# Patient Record
Sex: Female | Born: 1970 | Hispanic: Yes | Marital: Married | Smoking: Never smoker
Health system: Southern US, Community
[De-identification: ages and names within clinical notes are randomized; demographics above are authoritative.]

## PROBLEM LIST (undated history)

## (undated) DIAGNOSIS — I10 Essential (primary) hypertension: Secondary | ICD-10-CM

## (undated) HISTORY — PX: ABDOMINAL HYSTERECTOMY: SHX81

## (undated) HISTORY — PX: CHOLECYSTECTOMY: SHX55

---

## 1998-09-16 HISTORY — PX: FUNCTIONAL ENDOSCOPIC SINUS SURGERY: SUR616

## 2004-09-16 HISTORY — PX: TUBAL LIGATION: SHX77

## 2008-10-04 ENCOUNTER — Ambulatory Visit: Payer: Self-pay

## 2011-03-04 ENCOUNTER — Ambulatory Visit: Payer: Self-pay | Admitting: Family Medicine

## 2011-05-16 ENCOUNTER — Emergency Department: Payer: Self-pay | Admitting: Emergency Medicine

## 2012-06-17 ENCOUNTER — Ambulatory Visit: Payer: Self-pay | Admitting: Family Medicine

## 2013-07-18 ENCOUNTER — Emergency Department: Payer: Self-pay | Admitting: Emergency Medicine

## 2013-07-18 LAB — CBC
HCT: 30 % — ABNORMAL LOW (ref 35.0–47.0)
HGB: 10 g/dL — ABNORMAL LOW (ref 12.0–16.0)
Platelet: 188 10*3/uL (ref 150–440)
RBC: 4.33 10*6/uL (ref 3.80–5.20)
RDW: 16.4 % — ABNORMAL HIGH (ref 11.5–14.5)
WBC: 7.5 10*3/uL (ref 3.6–11.0)

## 2013-07-18 LAB — BASIC METABOLIC PANEL
Anion Gap: 5 — ABNORMAL LOW (ref 7–16)
Chloride: 107 mmol/L (ref 98–107)
Co2: 24 mmol/L (ref 21–32)
Creatinine: 0.6 mg/dL (ref 0.60–1.30)
EGFR (African American): >60
EGFR (Non-African Amer.): >60
Osmolality: 271 (ref 275–301)
Potassium: 3.8 mmol/L (ref 3.5–5.1)
Sodium: 136 mmol/L (ref 136–145)

## 2013-07-18 LAB — CK TOTAL AND CKMB (NOT AT ARMC): CK-MB: <0.5 ng/mL — ABNORMAL LOW (ref 0.5–3.6)

## 2014-06-29 ENCOUNTER — Ambulatory Visit: Payer: Self-pay | Admitting: Family Medicine

## 2014-09-16 HISTORY — PX: ABDOMINAL HYSTERECTOMY: SHX81

## 2014-09-20 ENCOUNTER — Ambulatory Visit: Payer: Self-pay | Admitting: Obstetrics and Gynecology

## 2014-09-20 LAB — BASIC METABOLIC PANEL
Anion Gap: 8 (ref 7–16)
BUN: 14 mg/dL (ref 7–18)
CREATININE: 0.66 mg/dL (ref 0.60–1.30)
Calcium, Total: 8.6 mg/dL (ref 8.5–10.1)
Chloride: 104 mmol/L (ref 98–107)
Co2: 26 mmol/L (ref 21–32)
GLUCOSE: 97 mg/dL (ref 65–99)
Osmolality: 276 (ref 275–301)
Potassium: 3.9 mmol/L (ref 3.5–5.1)
Sodium: 138 mmol/L (ref 136–145)

## 2014-09-20 LAB — HEMOGLOBIN: HGB: 10.3 g/dL — ABNORMAL LOW (ref 12.0–16.0)

## 2014-09-26 ENCOUNTER — Ambulatory Visit: Payer: Self-pay | Admitting: Obstetrics and Gynecology

## 2014-09-27 LAB — BASIC METABOLIC PANEL
Anion Gap: 8 (ref 7–16)
BUN: 7 mg/dL (ref 7–18)
CALCIUM: 8.2 mg/dL — AB (ref 8.5–10.1)
CHLORIDE: 108 mmol/L — AB (ref 98–107)
CO2: 24 mmol/L (ref 21–32)
CREATININE: 0.66 mg/dL (ref 0.60–1.30)
EGFR (African American): >60
EGFR (Non-African Amer.): >60
Glucose: 118 mg/dL — ABNORMAL HIGH (ref 65–99)
Osmolality: 278 (ref 275–301)
POTASSIUM: 4.1 mmol/L (ref 3.5–5.1)
Sodium: 140 mmol/L (ref 136–145)

## 2014-09-27 LAB — HEMATOCRIT: HCT: 29.9 % — ABNORMAL LOW (ref 35.0–47.0)

## 2015-01-09 LAB — SURGICAL PATHOLOGY

## 2015-01-15 NOTE — Op Note (Signed)
PATIENT NAME:  Veronica Padilla, Veronica Padilla MR#:  [id number] DATE OF BIRTH:  [birth year]  DATE OF PROCEDURE:  09/26/2014  PREOPERATIVE DIAGNOSIS: Menorrhagia unresponsive to conservative therapy.   POSTOPERATIVE DIAGNOSIS: Menorrhagia unresponsive to conservative therapy.   PROCEDURES: 1.  Laparoscopic supracervical hysterectomy.  2.  Bilateral salpingectomy.   SURGEON: Suzy Bouchard, MD   FIRST ASSISTANT: Christeen Douglas, MD  ANESTHESIA: General endotracheal.   INDICATION: A 44 year old gravida 3, para 3, a patient with persistent menorrhagia unresponsive to conservative therapy. The patient is status post 3 prior cesarean sections. The patient elects for permanent surgical intervention.   DESCRIPTION OF PROCEDURE: After adequate general endotracheal anesthesia, the patient was placed in the dorsal supine position. The patient's legs were placed in the [facility] stirrups. SCD compression stockings were on throughout the case. The patient did receive 2 grams IV cefoxitin prior to commencement of the case. The patient's abdomen, perineum, and vagina were prepped and draped in normal sterile fashion. A speculum was placed in the vagina and the anterior cervix was grasped with a single-tooth tenaculum, and uterine sound was advanced into the uterus and was taped together to the anterior tenaculum to be used for uterine manipulation during the procedure. Foley catheter was placed into the bladder yielding clear urine. Gloves were changed, and a 12 mm infraumbilical incision was made after injecting with 0.5% Marcaine. The laparoscope was advanced into the abdominal cavity under direct visualization with the Optiview cannula. The patient's abdomen was inflated with carbon dioxide. A 10 mm port was advanced in the left lower quadrant 3 cm medial to the left anterior iliac spine and under direct visualization the port was placed. A similar procedure was repeated on the patient's right side and again 3  cm medial to the right anterior iliac spine a 10 mm port was advanced. Of note, there was a large amount of dense adhesions from the omentum up to the anterior abdominal wall. Harmonic scalpel was brought up and these adhesions were removed without difficulty. The patient was placed in Trendelenburg and the left cornua was grasped with the atraumatic graspers. The round ligament was then clamped, cauterized with Harmonic scalpel. The uteroovarian ligament was then clamped and transected with the Harmonic scalpel and the uterine artery on the left was skeletonized. The bladder flap was cleared of adhesions, which had been previously noted, and the anterior bladder flap was then dissected free. The left uterine artery was then cauterized with the Kleppingers and then transected with the Harmonic scalpel, and a similar procedure was then repeated on the patient's right side. Again, the round ligament was clamped and transected with the Harmonic scalpel. The uteroovarian ligament was then transected and broad ligament was dissected with the Harmonic scalpel down to the level of the right uterine artery. This artery was cauterized with the Kleppingers and transected with the Harmonic scalpel, and the cervix was then transected at the level of the uterosacral ligaments, finally completed after the uterine sound was removed. The endocervical canal was then cauterized with the Kleppingers, aided by a vaginal hand. Good hemostasis was noted. The morcellator was brought up to the operative field and through the left port site the morcellator was advanced under direct visualization. The uterus was then morcellated through this port site. The left fallopian tube was then grasped at the proximal portion and was removed with the Harmonic scalpel. Good hemostasis noted. The patient's right fallopian tube also was removed with the Harmonic scalpel without difficulty. Good hemostasis noted. There  was a corpus luteal cyst noted on  the right. Otherwise, no other abnormalities were noted. Upper abdomen appeared normal. The patient's abdomen was copiously irrigated, and the pressure was lowered to 7 mmHg. Good hemostasis was noted. The patient's abdomen was deflated and all ports were removed. The left lower port site, right lower port site and infraumbilical incisions were closed with 2 layers, 1 with the fascial layer of 2-0 Vicryl suture, and all skin incisions were reapproximated with interrupted 4-0 Vicryl suture. Dermabond was placed on the incisions followed by small gauze and Tegaderm. The single-tooth tenaculum was removed from the anterior cervix. The tenacula site was oozing a bit and required silver nitrate for hemostasis. There were no complications. Estimated blood loss 25 mL. Intraoperative fluids 1300 mL. Urine output 400 mL. The patient was taken to the recovery room in good condition.  ____________________________ Suzy Bouchardhomas J. Shaunak Kreis, MD tjs:sb D: 09/26/2014 12:33:18 ET T: 09/26/2014 12:48:38 ET JOB#: [id number]  cc: Suzy Bouchardhomas J. Kaevon Cotta, MD, <Dictator> Suzy BouchardHOMAS J Alisson Rozell MD ELECTRONICALLY SIGNED 09/27/2014 9:18

## 2015-05-14 IMAGING — CR DG CHEST 2V
1 series · 1 of 1 positions shown · non-contrast
Comparison: none

REASON FOR EXAM: Chest Pain
COMMENTS:

[pa]
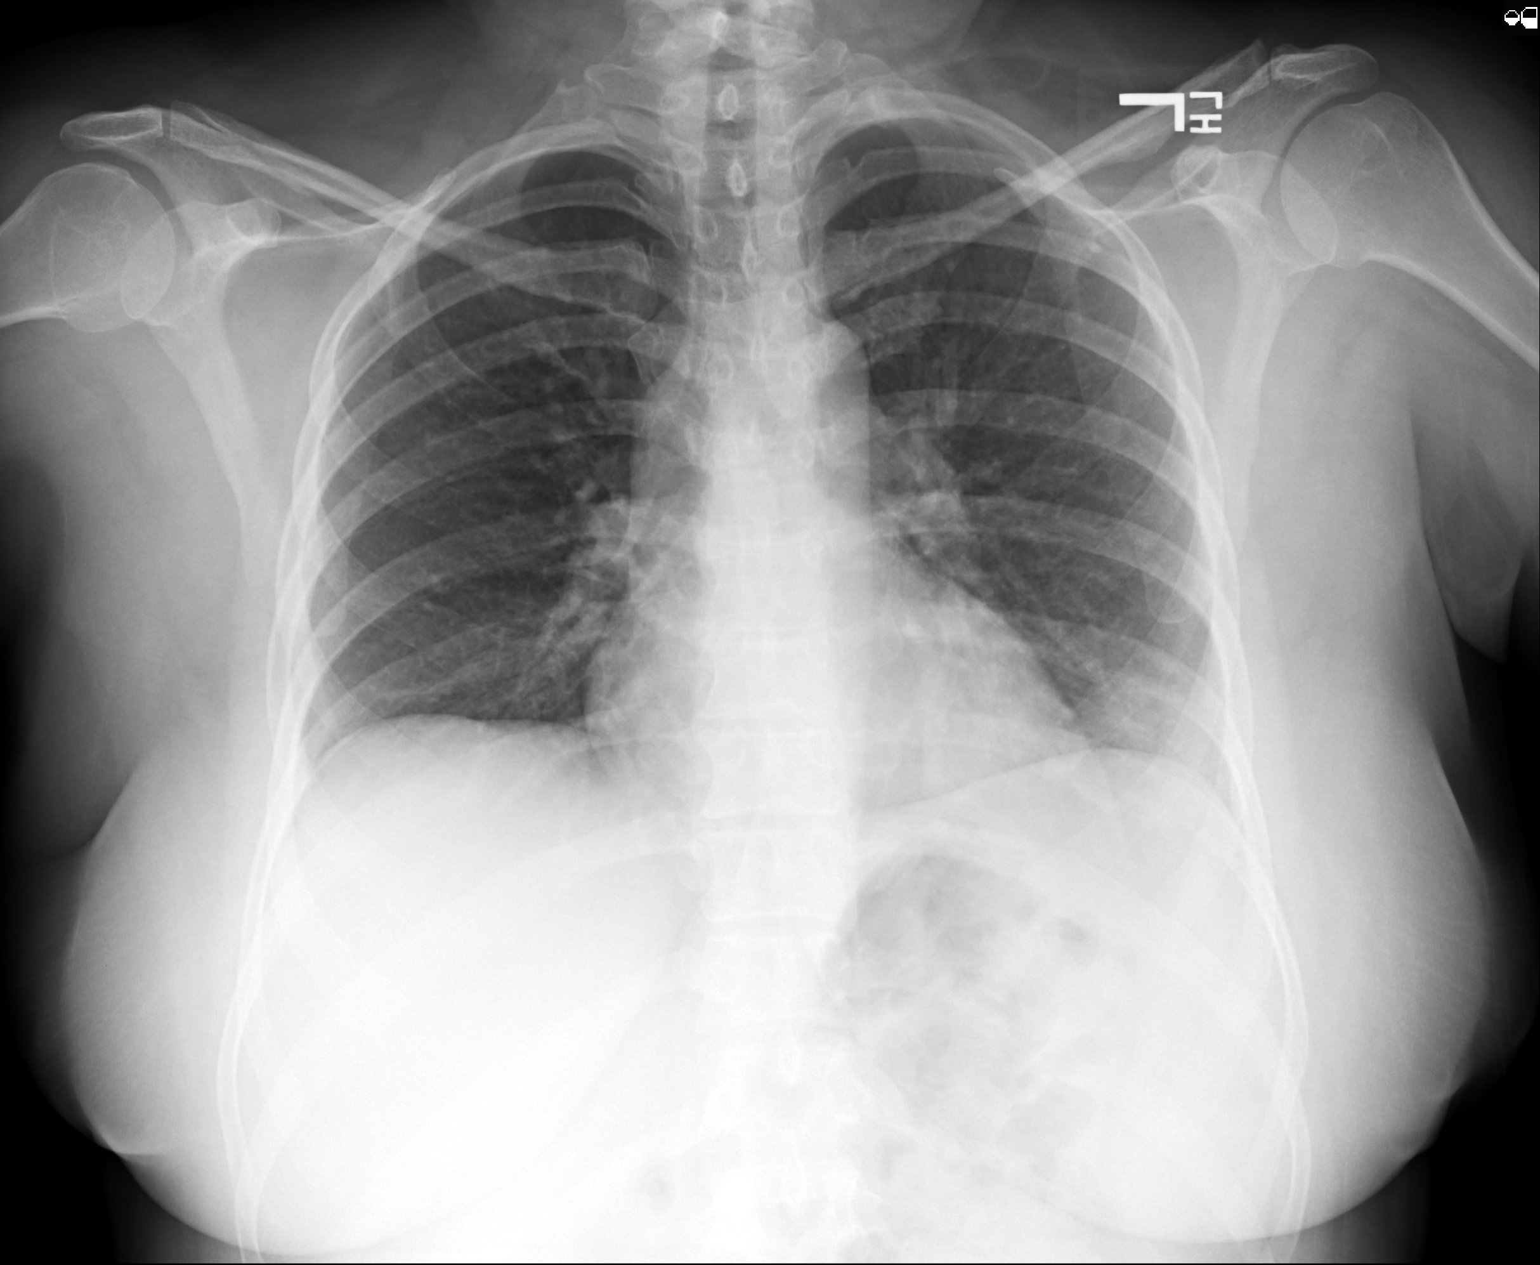

[1 of 1 positions shown; findings below may reference images not displayed]

PROCEDURE:     DXR - DXR CHEST PA (OR AP) AND LATERAL  - July 18, 2013  [DATE]

RESULT:     There is no previous study for comparison.

The lungs are clear. The heart and pulmonary vessels are normal. The bony
and mediastinal structures are unremarkable. There is no effusion. There is
no pneumothorax or evidence of congestive failure.
IMPRESSION: No acute cardiopulmonary disease.

[REDACTED]

## 2015-06-06 ENCOUNTER — Encounter: Payer: Self-pay | Admitting: Emergency Medicine

## 2015-06-06 ENCOUNTER — Emergency Department
Admission: EM | Admit: 2015-06-06 | Discharge: 2015-06-06 | Disposition: A | Discharge status: Home / Self Care | Payer: Self-pay | Attending: Emergency Medicine | Admitting: Emergency Medicine

## 2015-06-06 DIAGNOSIS — R51 Headache: Secondary | ICD-10-CM | POA: Insufficient documentation

## 2015-06-06 DIAGNOSIS — G4452 New daily persistent headache (NDPH): Secondary | ICD-10-CM

## 2015-06-06 DIAGNOSIS — R42 Dizziness and giddiness: Secondary | ICD-10-CM | POA: Insufficient documentation

## 2015-06-06 LAB — CBC
HCT: 38.6 % (ref 35.0–47.0)
Hemoglobin: 13.1 g/dL (ref 12.0–16.0)
MCH: 28.4 pg (ref 26.0–34.0)
MCHC: 33.8 g/dL (ref 32.0–36.0)
MCV: 84 fL (ref 80.0–100.0)
PLATELETS: 191 10*3/uL (ref 150–440)
RBC: 4.6 MIL/uL (ref 3.80–5.20)
RDW: 14.5 % (ref 11.5–14.5)
WBC: 8.4 10*3/uL (ref 3.6–11.0)

## 2015-06-06 LAB — BASIC METABOLIC PANEL WITH GFR
Anion gap: 8 (ref 5–15)
BUN: 13 mg/dL (ref 6–20)
CO2: 24 mmol/L (ref 22–32)
Calcium: 9.1 mg/dL (ref 8.9–10.3)
Chloride: 105 mmol/L (ref 101–111)
Creatinine, Ser: 0.65 mg/dL (ref 0.44–1.00)
GFR calc Af Amer: >60 mL/min
GFR calc non Af Amer: >60 mL/min
Glucose, Bld: 124 mg/dL — ABNORMAL HIGH (ref 65–99)
Potassium: 3.8 mmol/L (ref 3.5–5.1)
Sodium: 137 mmol/L (ref 135–145)

## 2015-06-06 LAB — URINALYSIS COMPLETE WITH MICROSCOPIC (ARMC ONLY)
BILIRUBIN URINE: NEGATIVE
Bacteria, UA: NONE SEEN
GLUCOSE, UA: NEGATIVE mg/dL
KETONES UR: NEGATIVE mg/dL
Leukocytes, UA: NEGATIVE
NITRITE: NEGATIVE
PROTEIN: NEGATIVE mg/dL
Specific Gravity, Urine: 1.011 (ref 1.005–1.030)
pH: 5 (ref 5.0–8.0)

## 2015-06-06 MED ORDER — PREDNISONE 20 MG PO TABS
60.0000 mg | ORAL_TABLET | Freq: Once | ORAL | Status: AC
  Administered 2015-06-06: 60 mg via ORAL
  Filled 2015-06-06: qty 3

## 2015-06-06 MED ORDER — DIPHENHYDRAMINE HCL 25 MG PO CAPS
50.0000 mg | ORAL_CAPSULE | Freq: Once | ORAL | Status: AC
  Administered 2015-06-06: 50 mg via ORAL
  Filled 2015-06-06: qty 2

## 2015-06-06 MED ORDER — DIPHENHYDRAMINE HCL 25 MG PO CAPS: 50.0000 mg | ORAL_CAPSULE | Freq: Four times a day (QID) | ORAL | Status: DC | PRN

## 2015-06-06 MED ORDER — NAPROXEN 500 MG PO TABS: 500.0000 mg | ORAL_TABLET | Freq: Two times a day (BID) | ORAL | Status: DC

## 2015-06-06 MED ORDER — KETOROLAC TROMETHAMINE 60 MG/2ML IM SOLN
60.0000 mg | Freq: Once | INTRAMUSCULAR | Status: AC
  Administered 2015-06-06: 60 mg via INTRAMUSCULAR
  Filled 2015-06-06: qty 2

## 2015-06-06 MED ORDER — METOCLOPRAMIDE HCL 10 MG PO TABS: 10.0000 mg | ORAL_TABLET | Freq: Three times a day (TID) | ORAL | Status: DC

## 2015-06-06 MED ORDER — PREDNISONE 20 MG PO TABS: 20.0000 mg | ORAL_TABLET | Freq: Every day | ORAL | Status: DC

## 2015-06-06 MED ORDER — METOCLOPRAMIDE HCL 10 MG PO TABS
20.0000 mg | ORAL_TABLET | Freq: Once | ORAL | Status: AC
  Administered 2015-06-06: 20 mg via ORAL
  Filled 2015-06-06 (×2): qty 2

## 2015-06-06 NOTE — ED Provider Notes (Signed)
[facility] Emergency Department Provider Note  ____________________________________________  Time seen: 2:20 PM  I have reviewed the triage vital signs and the nursing notes.   HISTORY  Chief Complaint Headache and Dizziness  interviewed and examined with the Spanish interpreter   HPI Veronica Padilla is a 44 y.o. female who complains of daily headaches for the past 2 weeks. The headaches are often on and seemed to be worse in the morning and last anywhere from a few minutes to a few hours. She had some Percocet left over from a hysterectomy in January she has been taking this off and on with some relief. Today the headache seemed worse than usual. She reports that her hands feel a little bit weaker and that she's had some blurry vision at times but her vision is currently normal. Change with position. No focal unilateral paresthesia or weakness. No trauma. No recent pregnancy. No chest pain shortness of breath fever chills nausea vomiting or neck pain.  The headache is bilateral frontal. Aching and throbbing   History reviewed. No pertinent past medical history.   There are no active problems to display for this patient.    Past Surgical History  Procedure Laterality Date  . Abdominal hysterectomy       Current Outpatient Rx  Name  Route  Sig  Dispense  Refill  . diphenhydrAMINE (BENADRYL) 25 mg capsule   Oral   Take 2 capsules (50 mg total) by mouth every 6 (six) hours as needed.   60 capsule   0   . metoCLOPramide (REGLAN) 10 MG tablet   Oral   Take 1 tablet (10 mg total) by mouth 4 (four) times daily -  before meals and at bedtime.   60 tablet   0   . naproxen (NAPROSYN) 500 MG tablet   Oral   Take 1 tablet (500 mg total) by mouth 2 (two) times daily with a meal.   14 tablet   0   . predniSONE (DELTASONE) 20 MG tablet   Oral   Take 1 tablet (20 mg total) by mouth daily.   8 tablet   0      Allergies Review of  patient's allergies indicates no known allergies.   No family history on file.  Social History Social History  Substance Use Topics  . Smoking status: Never Smoker   . Smokeless tobacco: None  . Alcohol Use: No    Review of Systems  Constitutional:   No fever or chills. No weight changes Eyes:  Intermittent blurry vision.  ENT:   No sore throat. Cardiovascular:   No chest pain. Respiratory:   No dyspnea or cough. Gastrointestinal:   Negative for abdominal pain, vomiting and diarrhea.  No BRBPR or melena. Genitourinary:   Negative for dysuria, urinary retention, bloody urine, or difficulty urinating. Musculoskeletal:   Negative for back pain. No joint swelling or pain. Skin:   Negative for rash. Neurological:   Positive headache as above. Psychiatric:  No anxiety or depression.   Endocrine:  No hot/cold intolerance, changes in energy, or sleep difficulty.  10-point ROS otherwise negative.  ____________________________________________   PHYSICAL EXAM:  VITAL SIGNS: ED Triage Vitals  Enc Vitals Group     BP 06/06/15 1247 155/87 mmHg     Pulse Rate 06/06/15 1247 96     Resp 06/06/15 1247 16     Temp 06/06/15 1247 98.3 F (36.8 C)     Temp Source 06/06/15 1247 Oral  SpO2 06/06/15 1247 99 %     Weight 06/06/15 1247 162 lb (73.483 kg)     Height 06/06/15 1247  (1.549 m)     Head Cir --      Peak Flow --      Pain Score 06/06/15 1247 8     Pain Loc --      Pain Edu? --      Excl. in GC? --      Constitutional:   Alert and oriented. Well appearing and in no distress. Eyes:   No scleral icterus. No conjunctival pallor. PERRL. EOMI. Funduscopy normal bilateral without papilledema ENT   Head:   Normocephalic and atraumatic.   Nose:   No congestion/rhinnorhea. No septal hematoma   Mouth/Throat:   MMM, no pharyngeal erythema. No peritonsillar mass. No uvula shift.   Neck:   No stridor. No SubQ emphysema. No  meningismus. Hematological/Lymphatic/Immunilogical:   No cervical lymphadenopathy. Cardiovascular:   RRR. Normal and symmetric distal pulses are present in all extremities. No murmurs, rubs, or gallops. Respiratory:   Normal respiratory effort without tachypnea nor retractions. Breath sounds are clear and equal bilaterally. No wheezes/rales/rhonchi. Gastrointestinal:   Soft and nontender. No distention. There is no CVA tenderness.  No rebound, rigidity, or guarding. Genitourinary:   deferred Musculoskeletal:   Nontender with normal range of motion in all extremities. No joint effusions.  No lower extremity tenderness.  No edema. Neurologic:   Normal speech and language.  CN 2-10 normal. Motor grossly intact. No pronator drift.  Normal gait. No gross focal neurologic deficits are appreciated.  Skin:    Skin is warm, dry and intact. No rash noted.  No petechiae, purpura, or bullae. Psychiatric:   Mood and affect are normal. Speech and behavior are normal. Patient exhibits appropriate insight and judgment.  ____________________________________________    LABS (pertinent positives/negatives) (all labs ordered are listed, but only abnormal results are displayed) Labs Reviewed  BASIC METABOLIC PANEL - Abnormal; Notable for the following:    Glucose, Bld 124 (*)    All other components within normal limits  CBC  URINALYSIS COMPLETEWITH MICROSCOPIC ([facility] ONLY)   ____________________________________________   EKG  Interpreted by me Normal sinus rhythm rate of 88, normal axis intervals QRS ST segments and T waves. There is one PVC on the strip  ____________________________________________    RADIOLOGY    ____________________________________________   PROCEDURES   ____________________________________________   INITIAL IMPRESSION / ASSESSMENT AND PLAN / ED COURSE  Pertinent labs & imaging results that were available during my care of the patient were reviewed by me and  considered in my medical decision making (see chart for details).  Patient presents with a daily headache that is not consistent with any concerning headache pattern such as stroke, intracranial hemorrhage, pseudotumor encephalitis meningitis glaucoma temporal arteritis. Exam and labs are reassuring. We'll treat symptomatically and plan for outpatient follow-up.     ____________________________________________   FINAL CLINICAL IMPRESSION(S) / ED DIAGNOSES  Final diagnoses:  NPDH (new persistent daily headache)      Sharman Cheek, MD 06/06/15 1440

## 2015-06-06 NOTE — ED Notes (Signed)
Per interpreter pt understands medications and D/c instructions.

## 2015-06-06 NOTE — ED Notes (Signed)
Pt reports severe headache since Sunday, reports taking ibuprofen with little improvement. Pt reports dizziness, nausea and weakness.

## 2015-06-06 NOTE — ED Notes (Signed)
Per interpreter, Pt reports intermittent HA for the past few weeks this am worse. Pt reports this HA has been there since 0600 this am. Pt reports vision changes and reports she feels weak in her hands. Pt reports pain is worse in the am. Denies falls or other injuries in the last 2 months.

## 2015-06-06 NOTE — Discharge Instructions (Signed)
Dolor de cabeza general sin causa  (General Headache Without Cause)   EL dolor de cabeza es un dolor o malestar que se siente en la zona de la cabeza o del cuello. Puede no tener una causa especfica. Hay muchas causas y tipos de dolores de [place]. Los ms comunes son:   Cefalea tensional.  Cefaleas migraosas.  Cefalea en brotes.  Cefaleas diarias crnicas. INSTRUCCIONES PARA EL CUIDADO EN EL HOGAR   Cumpla con todas las citas programadas con su mdico o con el especialista al que lo hayan derivado.  Slo tome medicamentos de venta libre o recetados para Primary school teacher o Environmental health practitioner, segn las indicaciones de su mdico.  Cuando sienta dolor de cabeza acustese en un cuarto oscuro y tranquilo.  Lleve un registro diario para Financial risk analyst lo que Press photographer. Por ejemplo, escriba:  Lo que come y bebe.  Cunto tiempo duerme.  Todo cambio en la dieta o medicamentos.  Intente con masajes u otras tcnicas de relajacin.  Colquese compresas de hielo o calor en la cabeza y en el cuello. selos 3 a 4 veces por da de 15 a 20 minutos por vez, o como sea necesario.  Limite las situaciones de estrs.  Sintese con la espalda recta y no  tense los msculos.  Si fuma, deje de hacerlo.  Limite el consumo de bebidas alcohlicas.  Consuma menos cantidad de cafena o deje de tomarla.  Coma y duerma en horarios regulares.  Duerma entre 7 y 9 horas o como lo indique su mdico.  Dietitian las luces tenues si le molestan las luces brillantes y Hospital doctor dolor de [place]. SOLICITE ATENCIN MDICA SI:   Tiene problemas con los Arboriculturist.  Los medicamentos no Education officer, environmental.  El dolor de cabeza que senta habitualmente es diferente.  Tiene nuseas o vmitos. SOLICITE ATENCIN MDICA DE INMEDIATO SI:   El dolor se hace cada vez ms intenso.  Tiene fiebre.  Presenta rigidez en el cuello.  Sufre prdida de la visin.  Presenta debilidad muscular o prdida  del control muscular.  Comienza a perder el equilibrio o tiene problemas para caminar.  Sufre mareos o se desmaya.  Tiene sntomas graves que son diferentes a los primeros sntomas. ASEGRESE DE QUE:   Comprende estas instrucciones.  Controlar su enfermedad.  Solicitar ayuda de inmediato si no mejora o empeora. Document Released: 06/12/2005 Document Revised: 03/03/2012 [facility] Patient Information 2015 [place], [place]. This information is not intended to replace advice given to you by your health care provider. Make sure you discuss any questions you have with your health care provider.  Dolor de Training and development officer, preguntas frecuentes y sus respuestas (Headaches, Frequently Asked Questions) CEFALEAS MIGRAOSAS P: Qu es la migraa? Qu la ocasiona? Cmo puedo tratarla? R: En general, la migraa comienza como un dolor apagado. Luego progresa hacia un dolor, constante, punzante y como un latido. Sentir Scientist, physiological las sienes. Podr sentir Radiographer, therapeutic parte anterior o posterior de la cabeza, o en uno o ambos lados. El dolor suele estar acompaado de una combinacin de:  Nuseas.  Vmitos.  Sensibilidad a la luz y los ruidos. Algunas personas (un 15%) experimentan un aura (ver abajo) antes de un ataque. La causa de la migraa se debe a reacciones qumicas del cerebro. El tratamiento para la migraa puede incluir medicamentos de [place]. Tambin puede incluir tcnicas de [place]. Estas incluyen entrenamientos para la relajacin y biorretroalimentacin.  P: Qu es un aura? R: Alrededor del  15% de las personas con migraa tiene un "aura". Es una seal de sntomas neurolgicos que ocurren antes de un dolor de cabeza por migraas. Podr ver lneas onduladas o irregulares, puntos o luces parpadeantes. Podr experimentar visin de tnel o puntos ciegos en uno o ambos ojos. El aura puede incluir alucinaciones visuales o auditivas (algo que se imagina). Puede incluir trastornos en el olfato  (como olores extraos), el tacto o el gusto. Entre otros sntomas se incluyen:  Adormecimiento.  Sensacin de hormigueo.  Dificultad para recordar o Occupational hygienist. Estos episodios neurolgicos pueden durar hasta 60 minutos. Los sntomas desaparecern a medida que el dolor de cabeza comience. P:Qu es un disparador? R: Ciertos factores fsicos o Sports administrator a "disparar" una migraa. Estos son:  Alimentos.  Cambios hormonales.  Clima.  Estrs. Es importante recordar que los disparadores son diferentes entre si. Para ayudar a prevenir ataques de migraas, necesitar descubrir cules son los Psychologist, prison and probation services. Lleve un diario sobre sus dolores de [place]. Este es un buen modo para descubrir los disparadores. El Sales executive en el momento de hablar con el profesional acerca de su enfermedad. P: El clima afecta en las migraas? R: La luz solar, el calor, la humedad y lo cambios drsticos en la presin Software engineer a, o "disparar" un ataque de migraa en [employer]. Pero estudios han demostrado que el clima no acta como disparador para todas las personas con [place]. P: Cul es la relacin entre la migraa y la hormonas? R: Las hormonas inician y regulan muchas de las funciones corporales. Las hormonas [employer] balance en el cuerpo dentro de los constantes cambios de [place]. Algunas veces, el nivel de hormonas en el cuerpo se desbalancea. Por ejemplo, durante la menstruacin, el embarazo o la [place]. Pueden ser la causa de un ataque de migraa. De hecho, alrededor de tres cuartos de las mujeres con migraa informan que sus ataques estn relacionados con el ciclo menstrual.  P: Aumenta el riesgo de sufrir un choque cardaco en las personas que padecen migraa? R: La probabilidad de que un ataque de migraa ocasione un ataque cardaco es muy remota. Esto no quiere [employer] persona que sufre de migraa no pueda tener un  ataque cardaco asociado con ella. En las personas menores de 40 aos, el factor ms comn para un ataque es la [place]. Pero durante la vida de una persona, la ocurrencia de un dolor de cabeza por migraa est asociada con una reduccin en el riesgo de morir por un ataque cerebrovascular.  P: Cules son los medicamentos para la migraa? R: La medicacin precisa se [place] para tratar el dolor de cabeza una vez que ha comenzado. Son ejemplos, medicamentos de [place], desinflamatorios sin esteroides, ergotamnicos y triptanos.  P: Qu son los triptanos? R: Lo triptanos son Yaakov Guthrie clase de medicamentos abortivos. Son especficos para tratar este problema. Los triptanos son vasoconstrictores. Moderan algunas reacciones qumicas del cerebro. Los triptanos trabajan como receptores del cerebro. Ayudan a Warehouse manager de un neurotransmisor denominado serotonina. Se cree que las fluctuaciones en los [place] de serotonina son la causa principal de la migraa.  P: Son [employer] de venta libre para la migraa? R: Los medicamentos de [employer] pueden ser efectivos para Paramedic dolores leves a moderados y los sntomas asociados a la [place]. Pero deber consultar a un mdico antes de Charity fundraiser tratamiento para la migraa.  P: Cules son los medicamentos de prevencin  de la migraa? R: Se suele denominar tratamiento "profilctico" a los medicamentos para la prevencin de la migraa. Se utilizan para reducir la frecuencia, gravedad y duracin de los ataques de [place]. Son ejemplos de medicamentos de prevencin: antiepilpticos, antidepresivos, bloqueadores beta, bloqueadores de los canales de calcio y medicamentos antiinflamatorios sin estPrint production plannereroides. P:  Por qu se utilizan anticonvulsivantes para tratar la migraa? R: Durante los ltimos aos, ha habido un creciente inters en las drogas antiepilpticas para la prevencin de la migraa. A menudo se los conoce como  "anticonvulsivantes". La epilepsia y la migraa suceden por reacciones similares en el cerebro.  P:  Por qu se utilizan antidepresivos para tratar la migraa? R: Los antidepresivos tpicamente se La[place] People's Democratic Republicutilizan para tratar a las personas con depresin. Pueden reducir la frecuencia de la migraa a travs de la regulacin de los niveles qumicos, como la serotonina, en el cerebro.  P:  Por qu se utilizan terapias alternativas para tratar la migraa? R: El trmino "terapias alternativas" suelen utilizarse para describir los tratamientos que se considera que estn por fuera de Occupational hygienistalcance la medicina occidental convencional. Son ejemplos de las terapias alternativas: la acupuntura, la acupresin y el yoga. Otra terapia alternativa comn es la terapia herbal. Se cree que algunas hierbas ayudan a Corning Incorporatedaliviar los dolores de [place]. Siempre consulte con Bed Bath & Beyondel profesional acerca de las terapias alternativas antes de [place] View Ranchesutilizarlas. Algunos productos herbales contienen arsnico y [place]otras toxinas. DOLORES DE CABEZA POR TENSIN P: Qu es un dolor de cabeza por tensin? Qu lo ocasiona? Cmo puedo tratarlo? R: Los dolores de cabeza por tensin ocurren al azar. A menudo son el resultado de estrs temporario, ansiedad, fatiga o ira. Los sntomas Environmental education officerincluyen dolor en las sienes, una sensacin como de tener una banda alrededor de la cabeza (un dolor que "presiona"). Los sntomas pueden incluir una sensacin de Roachesterempuje, de presin y Engineer, materialscontraccin de los msculos de la cabeza y el cuello. El dolor comienza en la frente, sienes o en la parte posterior de la cabeza y el cuello. El tratamiento para los dolores de cabeza por tensin puede incluir medicamentos de [place]a libre. Tambin puede incluir tcnicas de Peruautoayuda con entrenamientos para la relajacin y biorretroalimentacin. CEFALEA EN RACIMOS P: Qu es una cefalea en racimos? Qu la ocasiona? Cmo puedo tratarla? R: La cefalea en racimos toma su nombre debido a que los ataques vienen en  grupos. El dolor aparece con poco o ningn aviso. Normalmente ocurre de un lado de la cabeza. Muchas veces el dolor viene acompaado de un lagrimeo u ojo rojo y goteo de la nariz del mismo lado que Chief Technology Officerel dolor. Se cree que la causa es una reaccin en las sustancias qumicas del cerebro. Se describe como el caso ms grave e intenso de cualquier tipo de dolor de cabeza. El tratamiento incluye medicamentos bajo receta y oxgeno. CEFALEA SINUSAL P: Qu es una cefalea sinusal? Qu la ocasiona? Cmo puedo tratarla? R: Cuando se inflama una cavidad en los huesos de la cara y el crneo (sinus) ocasiona un dolor localizado. Esta enfermedad generalmente es el resultado de una reaccin alrgica, un tumor o una infeccin. Si el dolor de cabeza est ocasionado por un bloqueo del sinus, como una infeccin, probablemente tendr Halmafiebre. Una imagen de rayos X confirmar el bloqueo del sinus. El tratamiento indicado por el mdico podr incluir antibiticos para la infeccin, y tambin antihistamnicos o descongestivos.  DOLOR DE CABEZA POR EFECTO "REBOTE" P: Qu es un dolor de cabeza por efecto "rebote"? Qu lo ocasiona? Cmo puedo tratarlo?  R: Si se toman medicamentos para el dolor de cabeza muy a menudo puede llevar a la enfermedad conocida como "dolor de cabeza por rebote". Un patrn de abuso de medicamentos para el dolor de cabeza supone tomarlos ms de [employer] por semana o en cantidades excesivas. Esto significa ms que lo que indica el envase o el mdico. Con los dolores de cabeza por rebote, los medicamentos no slo dejan de Engineer, materials sino que adems comienzan a Data processing manager dolores de [place]. Los mdicos tratan los dolores de cabeza por rebote mediante la disminucin del medicamento del que se ha abusado. A veces el medico podr sustituir gradualmente por un tipo diferente de tratamiento o medicacin. Dejar de consumirlo podra ser difcil. El abuso regular de un medicamento aumenta el potencial que se  produzcan efectos secundarios graves. Consulte con un mdico si utiliza regularmente medicamentos para Chief Technology Officer de cabeza ms de [address] por semana o ms de lo que indica el envase. PREGUNTAS Y RESPUESTAS ADICIONALES P: Qu es la biorretroalimentacin? R: La biorretroalimentacin es un tratamiento de [place]. La biorretroalimentacin utiliza un equipamiento especial para controlar los movimientos involuntarios del cuerpo y las respuestas fsicas. La biorretroalimentacin controla:  Respiracin.  P[place]so.  Latidos cardacos.  Temperatura.  Tensin muscular.  Actividad cerebrales. La biorretroalimentacin le ayudar a mejorar y Production manager sus ejercicios de Microbiologist. Aprender a Chief Operating Officer las respuestas fsicas relacionadas con el estrs. Una vez que se dominan las tcnicas no necesitar ms el equipamiento. P: Son hereditarios los dolores de [place]? R: Segn algunas estimaciones, aproximadamente 28 millones de estadounidenses sufren migraa. Cuatro de cada cinco (80%) informan una historia familiar de migraa. Los investigadores no pueden asegurar si se trata de un problema gentico o Patent examiner. A pesar de esto, un nio tiene 50% de probabilidades de sufrir migraa si uno de sus padres la sufre. El nio tiene un 75% de probabilidades si ambos padres la sufren.  P. Puede un nio tener migraa? R: En el momento de ingresar a la escuela secundaria, la mayora de los jvenes han experimentado algn tipo de cefalea. Algunos abordajes o medicamentos seguros y Sports administrator las cefaleas o detenerlas luego de que han comenzado.  Olin Hauser tipo de especialista debe ver para diagnosticar y tratar una cefalea? R: Comience con su mdico de cabecera. Huel [place] de su experiencia y abordaje de las cefaleas. Comente los mtodos de clasificacin, diagnstico y [place]. El profesional decidir si lo derivar a Music therapist, segn los sntomas u otras enfermedades. El  hecho de sufrir diabetes, Environmental consultant, Catering manager, puede requerir un abordaje ms complejo. La National Headache Foundation (Fundacin Nacional para las [place]) Chief Technology Officer, a pedido, Physiological scientist lista de los mdicos que son miembros de [place]. Document Released: 08/15/2008 Document Revised: 11/25/2011 [facility] Patient Information 2015 [place], [place]. This information is not intended to replace advice given to you by your health care provider. Make sure you discuss any questions you have with your health care provider.

## 2018-10-30 ENCOUNTER — Other Ambulatory Visit: Payer: Self-pay | Admitting: Family Medicine

## 2018-10-30 DIAGNOSIS — R590 Localized enlarged lymph nodes: Secondary | ICD-10-CM

## 2020-08-15 ENCOUNTER — Other Ambulatory Visit: Payer: Self-pay

## 2020-08-15 ENCOUNTER — Encounter: Payer: Self-pay | Admitting: *Deleted

## 2020-08-15 DIAGNOSIS — Z5321 Procedure and treatment not carried out due to patient leaving prior to being seen by health care provider: Secondary | ICD-10-CM | POA: Insufficient documentation

## 2020-08-15 DIAGNOSIS — R519 Headache, unspecified: Secondary | ICD-10-CM | POA: Insufficient documentation

## 2020-08-15 NOTE — ED Triage Notes (Signed)
Pt to triage via wheelchair   Pt has a headache since 1900 tonight.  No n/v/d.  No otc meds . Pt alert  Speech clear.  Interpreter on a stick used in triage.

## 2020-08-16 ENCOUNTER — Emergency Department
Admission: EM | Admit: 2020-08-16 | Discharge: 2020-08-16 | Disposition: A | Discharge status: Home / Self Care | Payer: Self-pay | Attending: Emergency Medicine | Admitting: Emergency Medicine

## 2020-08-16 NOTE — ED Notes (Signed)
No answer when called several times from lobby 

## 2020-12-21 ENCOUNTER — Observation Stay
Admission: EM | Admit: 2020-12-21 | Discharge: 2020-12-24 | Disposition: A | Discharge status: Home / Self Care | Payer: BC Managed Care – PPO | Attending: Internal Medicine | Admitting: Internal Medicine

## 2020-12-21 ENCOUNTER — Other Ambulatory Visit: Payer: Self-pay

## 2020-12-21 ENCOUNTER — Emergency Department: Payer: BC Managed Care – PPO

## 2020-12-21 DIAGNOSIS — Z20822 Contact with and (suspected) exposure to covid-19: Secondary | ICD-10-CM | POA: Diagnosis not present

## 2020-12-21 DIAGNOSIS — B179 Acute viral hepatitis, unspecified: Secondary | ICD-10-CM | POA: Insufficient documentation

## 2020-12-21 DIAGNOSIS — I10 Essential (primary) hypertension: Secondary | ICD-10-CM | POA: Diagnosis not present

## 2020-12-21 DIAGNOSIS — Z8616 Personal history of COVID-19: Secondary | ICD-10-CM | POA: Diagnosis not present

## 2020-12-21 DIAGNOSIS — Z2839 Other underimmunization status: Secondary | ICD-10-CM | POA: Diagnosis not present

## 2020-12-21 DIAGNOSIS — R7989 Other specified abnormal findings of blood chemistry: Secondary | ICD-10-CM

## 2020-12-21 DIAGNOSIS — K759 Inflammatory liver disease, unspecified: Secondary | ICD-10-CM

## 2020-12-21 DIAGNOSIS — K8070 Calculus of gallbladder and bile duct without cholecystitis without obstruction: Secondary | ICD-10-CM

## 2020-12-21 DIAGNOSIS — K529 Noninfective gastroenteritis and colitis, unspecified: Secondary | ICD-10-CM

## 2020-12-21 DIAGNOSIS — E65 Localized adiposity: Secondary | ICD-10-CM | POA: Diagnosis present

## 2020-12-21 DIAGNOSIS — R1011 Right upper quadrant pain: Secondary | ICD-10-CM

## 2020-12-21 DIAGNOSIS — R4701 Aphasia: Secondary | ICD-10-CM | POA: Insufficient documentation

## 2020-12-21 DIAGNOSIS — R739 Hyperglycemia, unspecified: Secondary | ICD-10-CM

## 2020-12-21 DIAGNOSIS — K8071 Calculus of gallbladder and bile duct without cholecystitis with obstruction: Secondary | ICD-10-CM | POA: Diagnosis not present

## 2020-12-21 DIAGNOSIS — Z79899 Other long term (current) drug therapy: Secondary | ICD-10-CM | POA: Insufficient documentation

## 2020-12-21 DIAGNOSIS — R109 Unspecified abdominal pain: Secondary | ICD-10-CM

## 2020-12-21 HISTORY — DX: Essential (primary) hypertension: I10

## 2020-12-21 LAB — CBC
HCT: 42.6 % (ref 36.0–46.0)
Hemoglobin: 14.9 g/dL (ref 12.0–15.0)
MCH: 30.9 pg (ref 26.0–34.0)
MCHC: 35 g/dL (ref 30.0–36.0)
MCV: 88.4 fL (ref 80.0–100.0)
Platelets: 217 10*3/uL (ref 150–400)
RBC: 4.82 MIL/uL (ref 3.87–5.11)
RDW: 13 % (ref 11.5–15.5)
WBC: 5.6 10*3/uL (ref 4.0–10.5)
nRBC: 0 % (ref 0.0–0.2)

## 2020-12-21 LAB — URINALYSIS, COMPLETE (UACMP) WITH MICROSCOPIC
Bilirubin Urine: NEGATIVE
Glucose, UA: NEGATIVE mg/dL
Ketones, ur: 20 mg/dL — AB
Leukocytes,Ua: NEGATIVE
Nitrite: NEGATIVE
Protein, ur: NEGATIVE mg/dL
Specific Gravity, Urine: 1.006 (ref 1.005–1.030)
pH: 6 (ref 5.0–8.0)

## 2020-12-21 LAB — COMPREHENSIVE METABOLIC PANEL
ALT: 1210 U/L — ABNORMAL HIGH (ref 0–44)
AST: 735 U/L — ABNORMAL HIGH (ref 15–41)
Albumin: 4.4 g/dL (ref 3.5–5.0)
Alkaline Phosphatase: 194 U/L — ABNORMAL HIGH (ref 38–126)
Anion gap: 10 (ref 5–15)
BUN: 8 mg/dL (ref 6–20)
CO2: 26 mmol/L (ref 22–32)
Calcium: 9.3 mg/dL (ref 8.9–10.3)
Chloride: 101 mmol/L (ref 98–111)
Creatinine, Ser: 0.5 mg/dL (ref 0.44–1.00)
GFR, Estimated: >60 mL/min (ref >60)
Glucose, Bld: 114 mg/dL — ABNORMAL HIGH (ref 70–99)
Potassium: 3.5 mmol/L (ref 3.5–5.1)
Sodium: 137 mmol/L (ref 135–145)
Total Bilirubin: 3.3 mg/dL — ABNORMAL HIGH (ref 0.3–1.2)
Total Protein: 8.2 g/dL — ABNORMAL HIGH (ref 6.5–8.1)

## 2020-12-21 LAB — RESP PANEL BY RT-PCR (FLU A&B, COVID) ARPGX2
Influenza A by PCR: NEGATIVE
Influenza B by PCR: NEGATIVE
SARS Coronavirus 2 by RT PCR: NEGATIVE

## 2020-12-21 LAB — LIPASE, BLOOD: Lipase: 40 U/L (ref 11–51)

## 2020-12-21 LAB — GLUCOSE, CAPILLARY: Glucose-Capillary: 162 mg/dL — ABNORMAL HIGH (ref 70–99)

## 2020-12-21 LAB — PROTIME-INR
INR: 1 (ref 0.8–1.2)
Prothrombin Time: 13.1 seconds (ref 11.4–15.2)

## 2020-12-21 MED ORDER — HYDRALAZINE HCL 25 MG PO TABS: 25.0000 mg | ORAL_TABLET | Freq: Three times a day (TID) | ORAL | Status: DC | PRN

## 2020-12-21 MED ORDER — MORPHINE SULFATE (PF) 4 MG/ML IV SOLN
4.0000 mg | Freq: Once | INTRAVENOUS | Status: AC
  Administered 2020-12-21: 4 mg via INTRAVENOUS
  Filled 2020-12-21: qty 1

## 2020-12-21 MED ORDER — IBUPROFEN 400 MG PO TABS
400.0000 mg | ORAL_TABLET | Freq: Four times a day (QID) | ORAL | Status: DC | PRN
  Administered 2020-12-22: 400 mg via ORAL
  Filled 2020-12-21: qty 1

## 2020-12-21 MED ORDER — LACTATED RINGERS IV BOLUS
1000.0000 mL | Freq: Once | INTRAVENOUS | Status: AC
  Administered 2020-12-21: 1000 mL via INTRAVENOUS

## 2020-12-21 MED ORDER — HYDROCHLOROTHIAZIDE 12.5 MG PO CAPS
12.5000 mg | ORAL_CAPSULE | Freq: Once | ORAL | Status: DC
  Filled 2020-12-21: qty 1

## 2020-12-21 MED ORDER — ENOXAPARIN SODIUM 40 MG/0.4ML ~~LOC~~ SOLN
40.0000 mg | SUBCUTANEOUS | Status: DC
  Administered 2020-12-21: 40 mg via SUBCUTANEOUS
  Filled 2020-12-21: qty 0.4

## 2020-12-21 MED ORDER — MORPHINE SULFATE (PF) 2 MG/ML IV SOLN
2.0000 mg | INTRAVENOUS | Status: AC | PRN
  Administered 2020-12-23: 2 mg via INTRAVENOUS
  Filled 2020-12-21: qty 1

## 2020-12-21 MED ORDER — INSULIN ASPART 100 UNIT/ML ~~LOC~~ SOLN: 0.0000 [IU] | Freq: Every day | SUBCUTANEOUS | Status: DC

## 2020-12-21 MED ORDER — ONDANSETRON HCL 4 MG/2ML IJ SOLN
4.0000 mg | Freq: Once | INTRAMUSCULAR | Status: AC
  Administered 2020-12-21: 4 mg via INTRAVENOUS
  Filled 2020-12-21: qty 2

## 2020-12-21 MED ORDER — HYDROCHLOROTHIAZIDE 12.5 MG PO CAPS
12.5000 mg | ORAL_CAPSULE | Freq: Every day | ORAL | Status: DC
  Administered 2020-12-21: 12.5 mg via ORAL
  Filled 2020-12-21: qty 1

## 2020-12-21 MED ORDER — KETOROLAC TROMETHAMINE 15 MG/ML IJ SOLN
15.0000 mg | Freq: Three times a day (TID) | INTRAMUSCULAR | Status: AC | PRN
  Filled 2020-12-21: qty 1

## 2020-12-21 MED ORDER — ONDANSETRON HCL 4 MG PO TABS: 4.0000 mg | ORAL_TABLET | Freq: Four times a day (QID) | ORAL | Status: DC | PRN

## 2020-12-21 MED ORDER — LACTATED RINGERS IV SOLN: INTRAVENOUS | Status: AC

## 2020-12-21 MED ORDER — INSULIN ASPART 100 UNIT/ML ~~LOC~~ SOLN
0.0000 [IU] | Freq: Three times a day (TID) | SUBCUTANEOUS | Status: DC
  Administered 2020-12-22: 2 [IU] via SUBCUTANEOUS
  Administered 2020-12-23 (×2): 3 [IU] via SUBCUTANEOUS
  Filled 2020-12-21 (×3): qty 1

## 2020-12-21 MED ORDER — ONDANSETRON HCL 4 MG/2ML IJ SOLN
4.0000 mg | Freq: Four times a day (QID) | INTRAMUSCULAR | Status: DC | PRN
  Administered 2020-12-23: 4 mg via INTRAVENOUS
  Filled 2020-12-21 (×2): qty 2

## 2020-12-21 NOTE — H&P (Addendum)
History and Physical   Veronica Padilla SJG:[id number] DOB: [birth year] DOA: 12/21/2020  [facility]  Patient coming from: Urgent care clinic  I have personally briefly reviewed patient's old medical records in [facility] EMR.  Chief Concern: right upper quadrant abdominal pain  HPI obtained with Spanish interpreter on a stick  HPI: Veronica Padilla is a 50 y.o. female with medical history significant for persistent menorrhagia unresponsive to conservative therapy status post laparoscopic supracervical hysterectomy and bilateral salpingectomy, status post cesarean C-section x3, oophorectomy, hypertension,  She had right upper quadrant abdominal pain, that started Tuesday evening, that referred to my back. She also noted dark yellow urine. She endorses intermittent dysuria. She has had pain like this before in January 6 or 7, 2022.  She states that at this time the pain is a 1/10 after the pain medication and on Tuesday, the pain was a 10/10. Prior to presentation to the emergency department today, the pain was a 5/10.  The describes the pain as persistent, takes her breath away, is uncomfortable with standing, laying down, and sitting. She endorses yellow loose stool this AM, 4x. She denies recent seafood ingestion or changes to her diet. She denies knowledge of noxious exposure.  She denies recent travel.  She endorses chills. She does not know if she had a fever.   She does not know if she has been vaccinated for hepatitis A or B. At bedside, she is able to tell me her full name, age, and her location of hospital.  She endorses taking two advils 220 mg each, 1 aspirin (320 mg), and one tylenol (325)in the AM with relief before presentation to the ED.   She reports that she is sexually active with just her spouse.  Social history: she lives with husband and son. She denies tobacco use, etoh use, and recreational drug use. She works in Advertising account executive, folding the socks on heavy machinery.   Vaccinations: yes, all three covid 19 vaccinations. She also had covid infection in January 2021.   ROS: Constitutional: no weight change, no fever ENT/Mouth: no sore throat, no rhinorrhea Eyes: no eye pain, no vision changes Cardiovascular: no chest pain, no dyspnea,  no edema, no palpitations Respiratory: no cough, no sputum, no wheezing Gastrointestinal: + nausea, no vomiting, + diarrhea, no constipation Genitourinary: no urinary incontinence, no dysuria, no hematuria Musculoskeletal: no arthralgias, no myalgias Skin: no skin lesions, no pruritus, Neuro: + weakness, no loss of consciousness, no syncope Psych: no anxiety, no depression, + decrease appetite Heme/Lymph: no bruising, no bleeding  ED Course: Discussed with ED provider  Assessment/Plan  Active Problems:   Acute hepatitis   Essential hypertension   Truncal obesity  # Right upper quadrant abdominal pain secondary to acute hepatitis-etiology work-up in progress # Gastroenteritis -suspect food contamination -Limited right upper quadrant abdominal ultrasound was read as heterogeneously increased hepatic parenchymal echogenicity may represent steatosis or other intrinsic hepatic cellular disease such as hepatitis.  No focal lesion.  Layering stones and sludge in the gallbladder without sonographic findings of acute cholecystitis.  No biliary dilatation. -Acute hepatitis panel ordered, acetaminophen level ordered -GI panel ordered -Supportive care -LFT in the a.m. for liver enzymes trending to ensure that it is coming down -Checking TSH -Pain control morphine 2 mg IV every 3 hours as needed for severe pain, ketorolac 50 mg IV every 6 hours as needed for moderate pain, ibuprofen 400 mg every 6 hours as needed for fever,  headache, mild pain  # Hypertension - previously on HCTZ 12.5 mg daily resumed  # Hepatic parenchymal echogenicity possible hepatosteatosis versus  hepatitis  Patient states that she takes a pill for her sugar-I was not able to find any antihyperglycemic medications -Insulin sliding scale with at bedtime coverage ordered  Chart reviewed.   DVT prophylaxis: Enoxaparin 40 mg subcutaneous every 24 hours Code Status: Daily Diet: Heart healthy/carb modified Family Communication: No Disposition Plan: Pending clinical course Consults called: None at this time, patient may benefit from outpatient follow-up with GI Admission status: Observation to MedSurg, no telemetry  Past Medical History:  Diagnosis Date  . Hypertension    Past Surgical History:  Procedure Laterality Date  . ABDOMINAL HYSTERECTOMY     Social History:  reports that she has never smoked. She has never used smokeless tobacco. She reports that she does not drink alcohol and does not use drugs.  No Known Allergies No family history on file. Family history: Family history reviewed and not pertinent  Prior to Admission medications   Medication Sig Start Date End Date Taking? Authorizing Provider  diphenhydrAMINE (BENADRYL) 25 mg capsule Take 2 capsules (50 mg total) by mouth every 6 (six) hours as needed. 06/06/15   Sharman Cheek, MD  metoCLOPramide (REGLAN) 10 MG tablet Take 1 tablet (10 mg total) by mouth 4 (four) times daily -  before meals and at bedtime. 06/06/15   Sharman Cheek, MD  naproxen (NAPROSYN) 500 MG tablet Take 1 tablet (500 mg total) by mouth 2 (two) times daily with a meal. 06/06/15   Sharman Cheek, MD  predniSONE (DELTASONE) 20 MG tablet Take 1 tablet (20 mg total) by mouth daily. 06/06/15   Sharman Cheek, MD   Physical Exam: Vitals:   12/21/20 1722 12/21/20 1725 12/21/20 1830  BP: (!) 166/105  (!) 148/95  Pulse: 95  98  Resp: 18  14  Temp: 97.7 F (36.5 C)    TempSrc: Oral    SpO2: 99%  99%  Weight:  74.4 kg   Height:  5' (1.524 m)    Constitutional: appears older than chronological age, NAD, calm, comfortable Eyes: PERRL,  lids and conjunctivae normal ENMT: Mucous membranes are moist. Posterior pharynx clear of any exudate or lesions. Age-appropriate dentition. Hearing appropriate Neck: normal, supple, no masses, no thyromegaly Respiratory: clear to auscultation bilaterally, no wheezing, no crackles. Normal respiratory effort. No accessory muscle use.  Cardiovascular: Regular rate and rhythm, no murmurs / rubs / gallops. No extremity edema. 2+ pedal pulses. No carotid bruits.  Abdomen: Obese abdomen, no tenderness, no masses palpated, no hepatosplenomegaly. Bowel sounds positive.  Musculoskeletal: no clubbing / cyanosis. No joint deformity upper and lower extremities. Good ROM, no contractures, no atrophy. Normal muscle tone.  Skin: no rashes, lesions, ulcers. No induration Neurologic: Sensation intact. Strength 5/5 in all 4.  Psychiatric: Normal judgment and insight. Alert and oriented x 3. Normal mood.   EKG: Not indicated  Chest x-ray on Admission: Not indicated  US Abdomen Limited RUQ (LIVER/GB)  Result Date: 12/21/2020 CLINICAL DATA:  Hepatitis. EXAM: ULTRASOUND ABDOMEN LIMITED RIGHT UPPER QUADRANT COMPARISON:  Abdominal ultrasound 07/07/2012 FINDINGS: Gallbladder: Physiologically distended. Layering sludge and multiple stones within the gallbladder. No gallbladder wall thickening or pericholecystic fluid. No sonographic Murphy sign noted by sonographer. Common bile duct: Diameter: 5 mm, normal Liver: No focal lesion identified. Heterogeneously increased in parenchymal echogenicity. Portal vein is patent on color Doppler imaging with normal direction of blood flow towards the liver. Other: No  right upper quadrant ascites. IMPRESSION: 1. Heterogeneously increased hepatic parenchymal echogenicity may represent steatosis or other intrinsic hepatocellular disease such as hepatitis. No focal lesion. 2. Layering stones and sludge in the gallbladder without sonographic findings of acute cholecystitis. No biliary  dilatation. Electronically Signed   By: Narda Rutherford M.D.   On: 12/21/2020 19:11   Labs on Admission: I have personally reviewed following labs  CBC: Recent Labs  Lab 12/21/20 1727  WBC 5.6  HGB 14.9  HCT 42.6  MCV 88.4  PLT 217   Basic Metabolic Panel: Recent Labs  Lab 12/21/20 1727  NA 137  K 3.5  CL 101  CO2 26  GLUCOSE 114*  BUN 8  CREATININE 0.50  CALCIUM 9.3   GFR: Estimated Creatinine Clearance: 76.7 mL/min (by C-G formula based on SCr of 0.5 mg/dL).  Liver Function Tests: Recent Labs  Lab 12/21/20 1727  AST 735*  ALT 1,210*  ALKPHOS 194*  BILITOT 3.3*  PROT 8.2*  ALBUMIN 4.4   Recent Labs  Lab 12/21/20 1727  LIPASE 40   No results for input(s): AMMONIA in the last 168 hours. Coagulation Profile: Recent Labs  Lab 12/21/20 1926  INR 1.0   Urine analysis:    Component Value Date/Time   COLORURINE AMBER (A) 12/21/2020 1728   APPEARANCEUR CLEAR (A) 12/21/2020 1728   LABSPEC 1.006 12/21/2020 1728   PHURINE 6.0 12/21/2020 1728   GLUCOSEU NEGATIVE 12/21/2020 1728   HGBUR SMALL (A) 12/21/2020 1728   BILIRUBINUR NEGATIVE 12/21/2020 1728   KETONESUR 20 (A) 12/21/2020 1728   PROTEINUR NEGATIVE 12/21/2020 1728   NITRITE NEGATIVE 12/21/2020 1728   LEUKOCYTESUR NEGATIVE 12/21/2020 1728   Cleofas Hudgins N Abednego Yeates D.O. [facility]  If 7PM-7AM, please contact overnight-coverage provider If 7AM-7PM, please contact day coverage provider [email or web address]  12/21/2020, 8:47 PM

## 2020-12-21 NOTE — ED Notes (Signed)
Provided crackers, water, blankets.

## 2020-12-21 NOTE — Plan of Care (Signed)
Pt. Orient and Alert X 4. Speak very little english, interpreter service provided and successfully used to communicate. Pt.. denies N/V/D. No complaint of pain or discomfort. LR @125  ml. Pt. Very pleasant. Continue to monitor

## 2020-12-21 NOTE — ED Notes (Signed)
Resent covid swab down to lab. Lab was unable to locate first swab.

## 2020-12-21 NOTE — ED Notes (Signed)
Calling lab to confirm correct tube for acute hepatitis panel. [facility] not working at this time.

## 2020-12-21 NOTE — ED Notes (Signed)
Pt to ED c/o RUQ abdominal pain that goes across R upper flank to upper R back that started 2 nights ago.  Had xray at [facility] clinic today, was told that something in her abdomen was inflammed, she is not sure if it was gallbladder or liver. Was told that gallbladder neck was inflammed. Told to come to ED for evaluation.   Rates pain at this time as 5/10. Dull, not sharp.  Urine is "orange colored" and it does hurt and burn slightly to void. Has had nausea, no vomiting, since 2d. 3 episodes of diarrhea this morning.

## 2020-12-21 NOTE — ED Notes (Signed)
Ultrasound at bedside

## 2020-12-21 NOTE — ED Notes (Signed)
EDP at bedside  

## 2020-12-21 NOTE — ED Triage Notes (Addendum)
Pt states that she has had pain in her right upper abdomen to her flank since Tuesday and that her urine has been orange- pt has had nausea, but no vomiting- pt having chills

## 2020-12-21 NOTE — ED Provider Notes (Signed)
[facility] Emergency Department Provider Note   ____________________________________________   Event Date/Time   First MD Initiated Contact with Patient 12/21/20 1735     (approximate)  I have reviewed the triage vital signs and the nursing notes.   HISTORY  Chief Complaint Flank Pain    HPI Veronica Padilla is a 50 y.o. female with past medical history of hypertension who presents to the ED complaining of abdominal pain.  Patient reports that she has been dealing with intermittent pain in the right upper quadrant of her abdomen wrapping around to her right flank for the past 2 days.  Patient has become constant and more severe starting last night.  It is been associated with nausea but she has not vomited and denies any fevers or chills.  She does state that her urine has appeared orange in color, denies dysuria. She was initially evaluated at the outpatient clinic and had labs concerning for elevation in AST, ALT, and bilirubin.  She was subsequently sent to the ED for further evaluation.  She reports taking 2 Tylenol pills last night but no more than that recently.  She does not drink alcohol and denies any drug use.  She denies any recent travel, does states she has had mild diarrhea recently.  She has never been diagnosed with hepatitis in the past.        Past Medical History:  Diagnosis Date  . Hypertension     There are no problems to display for this patient.   Past Surgical History:  Procedure Laterality Date  . ABDOMINAL HYSTERECTOMY      Prior to Admission medications   Medication Sig Start Date End Date Taking? Authorizing Provider  diphenhydrAMINE (BENADRYL) 25 mg capsule Take 2 capsules (50 mg total) by mouth every 6 (six) hours as needed. 06/06/15   Sharman Cheek, MD  metoCLOPramide (REGLAN) 10 MG tablet Take 1 tablet (10 mg total) by mouth 4 (four) times daily -  before meals and at bedtime. 06/06/15   Sharman Cheek,  MD  naproxen (NAPROSYN) 500 MG tablet Take 1 tablet (500 mg total) by mouth 2 (two) times daily with a meal. 06/06/15   Sharman Cheek, MD  predniSONE (DELTASONE) 20 MG tablet Take 1 tablet (20 mg total) by mouth daily. 06/06/15   Sharman Cheek, MD    Allergies Patient has no known allergies.  No family history on file.  Social History Social History   Tobacco Use  . Smoking status: Never Smoker  . Smokeless tobacco: Never Used  Substance Use Topics  . Alcohol use: No  . Drug use: No    Review of Systems  Constitutional: No fever/chills Eyes: No visual changes. ENT: No sore throat. Cardiovascular: Denies chest pain. Respiratory: Denies shortness of breath. Gastrointestinal: Positive for abdominal pain and nausea, no vomiting.  No diarrhea.  No constipation. Genitourinary: Negative for dysuria.  Positive for discolored urine. Musculoskeletal: Negative for back pain. Skin: Negative for rash. Neurological: Negative for headaches, focal weakness or numbness.  ____________________________________________   PHYSICAL EXAM:  VITAL SIGNS: ED Triage Vitals  Enc Vitals Group     BP 12/21/20 1722 (!) 166/105     Pulse Rate 12/21/20 1722 95     Resp 12/21/20 1722 18     Temp 12/21/20 1722 97.7 F (36.5 C)     Temp Source 12/21/20 1722 Oral     SpO2 12/21/20 1722 99 %     Weight 12/21/20 1725 164 lb (74.4  kg)     Height 12/21/20 1725 5' (1.524 m)     Head Circumference --      Peak Flow --      Pain Score 12/21/20 1723 6     Pain Loc --      Pain Edu? --      Excl. in GC? --     Constitutional: Alert and oriented. Eyes: Conjunctivae are normal. Head: Atraumatic. Nose: No congestion/rhinnorhea. Mouth/Throat: Mucous membranes are moist. Neck: Normal ROM Cardiovascular: Normal rate, regular rhythm. Grossly normal heart sounds. Respiratory: Normal respiratory effort.  No retractions. Lungs CTAB. Gastrointestinal: Soft and tender to palpation in the right upper  quadrant.  No CVA tenderness bilaterally. No distention. Genitourinary: deferred Musculoskeletal: No lower extremity tenderness nor edema. Neurologic:  Normal speech and language. No gross focal neurologic deficits are appreciated. Skin:  Skin is warm, dry and intact. No rash noted. Psychiatric: Mood and affect are normal. Speech and behavior are normal.  ____________________________________________   LABS (all labs ordered are listed, but only abnormal results are displayed)  Labs Reviewed  COMPREHENSIVE METABOLIC PANEL - Abnormal; Notable for the following components:      Result Value   Glucose, Bld 114 (*)    Total Protein 8.2 (*)    AST 735 (*)    ALT 1,210 (*)    Alkaline Phosphatase 194 (*)    Total Bilirubin 3.3 (*)    All other components within normal limits  URINALYSIS, COMPLETE (UACMP) WITH MICROSCOPIC - Abnormal; Notable for the following components:   Color, Urine AMBER (*)    APPearance CLEAR (*)    Hgb urine dipstick SMALL (*)    Ketones, ur 20 (*)    Bacteria, UA RARE (*)    All other components within normal limits  RESP PANEL BY RT-PCR (FLU A&B, COVID) ARPGX2  LIPASE, BLOOD  CBC  ACETAMINOPHEN LEVEL  HEPATITIS PANEL, ACUTE  PROTIME-INR    PROCEDURES  Procedure(s) performed (including Critical Care):  Procedures   ____________________________________________   INITIAL IMPRESSION / ASSESSMENT AND PLAN / ED COURSE       Female with past medical history of hypertension who presents to the ED with worsening right upper quadrant pain radiating to her right flank for the past 2 days associated with nausea.  She does have right upper quadrant tenderness on exam and labs here in the ED are again concerning for a hepatitis with ALT greater than AST.  We will check Tylenol level but patient denies significant Tylenol use, also denies alcohol consumption.  We will further assess with right upper quadrant ultrasound as well as hepatitis panel.  Plan to discuss  with hospitalist for admission for acute hepatitis.  Right upper quadrant ultrasound shows gallbladder sludge but no wall thickening or pericholecystic fluid to suggest cholecystitis.  There is liver nodularity consistent with a hepatitis, hepatitis panel pending.      ____________________________________________   FINAL CLINICAL IMPRESSION(S) / ED DIAGNOSES  Final diagnoses:  Hepatitis  Right flank pain     ED Discharge Orders    None       Note:  This document was prepared using Dragon voice recognition software and may include unintentional dictation errors.   Chesley Noon, MD 12/21/20 [phone]

## 2020-12-22 DIAGNOSIS — R945 Abnormal results of liver function studies: Secondary | ICD-10-CM

## 2020-12-22 DIAGNOSIS — R7401 Elevation of levels of liver transaminase levels: Secondary | ICD-10-CM | POA: Diagnosis not present

## 2020-12-22 DIAGNOSIS — E65 Localized adiposity: Secondary | ICD-10-CM | POA: Diagnosis not present

## 2020-12-22 DIAGNOSIS — R7989 Other specified abnormal findings of blood chemistry: Secondary | ICD-10-CM

## 2020-12-22 DIAGNOSIS — R1011 Right upper quadrant pain: Secondary | ICD-10-CM

## 2020-12-22 LAB — HEPATIC FUNCTION PANEL
ALT: 882 U/L — ABNORMAL HIGH (ref 0–44)
AST: 404 U/L — ABNORMAL HIGH (ref 15–41)
Albumin: 3.5 g/dL (ref 3.5–5.0)
Alkaline Phosphatase: 164 U/L — ABNORMAL HIGH (ref 38–126)
Bilirubin, Direct: 1.5 mg/dL — ABNORMAL HIGH (ref 0.0–0.2)
Indirect Bilirubin: 1 mg/dL — ABNORMAL HIGH (ref 0.3–0.9)
Total Bilirubin: 2.5 mg/dL — ABNORMAL HIGH (ref 0.3–1.2)
Total Protein: 6.8 g/dL (ref 6.5–8.1)

## 2020-12-22 LAB — HEPATITIS PANEL, ACUTE
HCV Ab: NONREACTIVE
Hep A IgM: NONREACTIVE
Hep B C IgM: NONREACTIVE
Hepatitis B Surface Ag: NONREACTIVE

## 2020-12-22 LAB — CBC
HCT: 38.5 % (ref 36.0–46.0)
Hemoglobin: 13.6 g/dL (ref 12.0–15.0)
MCH: 31 pg (ref 26.0–34.0)
MCHC: 35.3 g/dL (ref 30.0–36.0)
MCV: 87.7 fL (ref 80.0–100.0)
Platelets: 179 10*3/uL (ref 150–400)
RBC: 4.39 MIL/uL (ref 3.87–5.11)
RDW: 13.1 % (ref 11.5–15.5)
WBC: 4.1 10*3/uL (ref 4.0–10.5)
nRBC: 0 % (ref 0.0–0.2)

## 2020-12-22 LAB — BASIC METABOLIC PANEL
Anion gap: 8 (ref 5–15)
BUN: 6 mg/dL (ref 6–20)
CO2: 27 mmol/L (ref 22–32)
Calcium: 8.9 mg/dL (ref 8.9–10.3)
Chloride: 105 mmol/L (ref 98–111)
Creatinine, Ser: 0.63 mg/dL (ref 0.44–1.00)
GFR, Estimated: >60 mL/min (ref >60)
Glucose, Bld: 131 mg/dL — ABNORMAL HIGH (ref 70–99)
Potassium: 3.5 mmol/L (ref 3.5–5.1)
Sodium: 140 mmol/L (ref 135–145)

## 2020-12-22 LAB — GLUCOSE, CAPILLARY
Glucose-Capillary: 130 mg/dL — ABNORMAL HIGH (ref 70–99)
Glucose-Capillary: 100 mg/dL — ABNORMAL HIGH (ref 70–99)
Glucose-Capillary: 90 mg/dL (ref 70–99)
Glucose-Capillary: 96 mg/dL (ref 70–99)

## 2020-12-22 LAB — HIV ANTIBODY (ROUTINE TESTING W REFLEX): HIV Screen 4th Generation wRfx: NONREACTIVE

## 2020-12-22 LAB — ACETAMINOPHEN LEVEL: Acetaminophen (Tylenol), Serum: <10 ug/mL — ABNORMAL LOW (ref 10–30)

## 2020-12-22 LAB — HEMOGLOBIN A1C
Hgb A1c MFr Bld: 5.8 % — ABNORMAL HIGH (ref 4.8–5.6)
Mean Plasma Glucose: 119.76 mg/dL

## 2020-12-22 LAB — TSH: TSH: 2.088 u[IU]/mL (ref 0.350–4.500)

## 2020-12-22 MED ORDER — INDOCYANINE GREEN 25 MG IV SOLR
7.5000 mg | INTRAVENOUS | Status: AC
  Administered 2020-12-23: 7.5 mg via INTRAVENOUS
  Filled 2020-12-22: qty 10
  Filled 2020-12-22: qty 3

## 2020-12-22 NOTE — Consult Note (Addendum)
[facility] SURGICAL CONSULTATION NOTE (initial) - cpt: 85462   HISTORY OF PRESENT ILLNESS (HPI):  50 y.o. female presented to [facility] ED yesterday for evaluation of abdominal pain. Patient presented with 2 days of RUQ abdominal pain which radiated to her right flank. This had been constant and severe since the onset. She endorses associated nausea, diarrhea, and dark colored urine with the onset of the pain. No fever, chills, cough, CP, SOB. She does report that she had a similar episode of pain in January of this year but was told "she had a kidney infection." She has had a significant gynecologic surgical history including hysterectomy, salpingectomy, oophorectomy, and c-section x3. Work up in the ED revealed normal WBC at 5.6K, renal function normal with sCr - 0.50, she did have significant AST / ALT elevation (735 / 1210), hyperbilirubinemia to 3.3 (now 2.5 with direct accounting for 1.5), and normal lipase level. She did undergo RUQ [place] concerning for cholelithiasis without cholecystitis, normal CBD diameter. She was admitted to the medicine service.   This afternoon, she reports that her pain has significantly improved. No fever, nausea, emesis. She is hungry.   Surgery is consulted by hospitalist physician Dr. Marlin Canary, DO in this context for evaluation and management of possible passed gallstone.   PAST MEDICAL HISTORY (PMH):  Past Medical History:  Diagnosis Date   Hypertension      PAST SURGICAL HISTORY (PSH):  Past Surgical History:  Procedure Laterality Date   ABDOMINAL HYSTERECTOMY       MEDICATIONS:  Prior to Admission medications   Not on File     ALLERGIES:  No Known Allergies   SOCIAL HISTORY:  Social History   Socioeconomic History   Marital status: Married    Spouse name: Not on file   Number of children: Not on file   Years of education: Not on file   Highest education level: Not on file  Occupational History   Not on file  Tobacco Use    Smoking status: Never Smoker   Smokeless tobacco: Never Used  Substance and Sexual Activity   Alcohol use: No   Drug use: No   Sexual activity: Not on file  Other Topics Concern   Not on file  Social History Narrative   Not on file   Social Determinants of Health   Financial Resource Strain: Not on file  Food Insecurity: Not on file  Transportation Needs: Not on file  Physical Activity: Not on file  Stress: Not on file  Social Connections: Not on file  Intimate Partner Violence: Not on file     FAMILY HISTORY:  No family history on file.    REVIEW OF SYSTEMS:  Review of Systems  Constitutional: Positive for chills. Negative for fever.  HENT: Negative for congestion and sore throat.   Respiratory: Negative for cough and shortness of breath.   Cardiovascular: Negative for chest pain and palpitations.  Gastrointestinal: Positive for abdominal pain and nausea. Negative for constipation, diarrhea and vomiting.  All other systems reviewed and are negative.   VITAL SIGNS:  Temp:  [97.7 F (36.5 C)-98.3 F (36.8 C)] 98.2 F (36.8 C) (04/08 0749) Pulse Rate:  [79-98] 79 (04/08 0749) Resp:  [14-18] 18 (04/08 0749) BP: (135-170)/(75-105) 135/82 (04/08 0749) SpO2:  [97 %-99 %] 99 % (04/08 0749) Weight:  [74.3 kg-74.4 kg] 74.3 kg (04/07 2116)     Height: 5' (152.4 cm) Weight: 74.3 kg BMI (Calculated): 31.99   INTAKE/OUTPUT:  04/07 0701 -  04/08 0700 In: 1000 [IV Piggyback:1000] Out: 300 [Urine:300]  PHYSICAL EXAM:  Physical Exam Vitals and nursing note reviewed. Exam conducted with a chaperone present.  Constitutional:      General: She is not in acute distress.    Appearance: Normal appearance. She is obese. She is not ill-appearing.  HENT:     Head: Normocephalic and atraumatic.  Eyes:     General: No scleral icterus.    Conjunctiva/sclera: Conjunctivae normal.  Cardiovascular:     Rate and Rhythm: Normal rate and regular rhythm.     Pulses: Normal pulses.      Heart sounds: No murmur heard.   Pulmonary:     Effort: Pulmonary effort is normal. No respiratory distress.     Breath sounds: Normal breath sounds.  Abdominal:     General: Abdomen is protuberant. A surgical scar is present. There is no distension.     Palpations: Abdomen is soft.     Tenderness: There is abdominal tenderness (mild) in the right upper quadrant. There is no guarding or rebound.     Comments: Abdomen is soft, very mild RUQ tenderness, non-distended, no rebound/guarding, negative Murphy's  Genitourinary:    Comments: Deferred Musculoskeletal:     Right lower leg: No edema.     Left lower leg: No edema.  Skin:    General: Skin is warm and dry.     Coloration: Skin is not pale.     Findings: No erythema.  Neurological:     General: No focal deficit present.     Mental Status: She is alert and oriented to person, place, and time.  Psychiatric:        Mood and Affect: Mood normal.        Behavior: Behavior normal.      Labs:  CBC Latest Ref Rng & Units 12/22/2020 12/21/2020 06/06/2015  WBC 4.0 - 10.5 K/uL 4.1 5.6 8.4  Hemoglobin 12.0 - 15.0 g/dL 81.0 17.5 10.2  Hematocrit 36.0 - 46.0 % 38.5 42.6 38.6  Platelets 150 - 400 K/uL 179 217 191   CMP Latest Ref Rng & Units 12/22/2020 12/21/2020 06/06/2015  Glucose 70 - 99 mg/dL 585(I) 778(E) 423(N)  BUN 6 - 20 mg/dL 6 8 13   Creatinine 0.44 - 1.00 mg/dL 3.61 4.43  Sodium 135 - 145 mmol/L 140 137 137  Potassium 3.5 - 5.1 mmol/L 3.5 3.5 3.8  Chloride 98 - 111 mmol/L 105 101 105  CO2 22 - 32 mmol/L 27 26 24   Calcium 8.9 - 10.3 mg/dL 8.9 9.3 9.1  Total Protein 6.5 - 8.1 g/dL 6.8 1.54) -  Total Bilirubin 0.3 - 1.2 mg/dL 2.5(H) 3.3(H) -  Alkaline Phos 38 - 126 U/L 164(H) 194(H) -  AST 15 - 41 U/L 404(H) 735(H) -  ALT 0 - 44 U/L 882(H) 1,210(H) -     Imaging studies:   RUQ (12/21/2020) personally reviewed showing cholelithiasis without cholecystitis, and radiologist report reviewed: IMPRESSION: 1. Heterogeneously  increased hepatic parenchymal echogenicity may represent steatosis or other intrinsic hepatocellular disease such as hepatitis. No focal lesion. 2. Layering stones and sludge in the gallbladder without sonographic findings of acute cholecystitis. No biliary dilatation.   Assessment/Plan: (ICD-10's: K80.50) 50 y.o. female with RUQ pain found to have elevated AST/ALT and hyperbilirubinemia, which is now improved, thought to be possibly passed gallstone, which does seem likely.     - Will plan on repeating LFTs tomorrow morning. If these continue to improve and her pain remains resolved,  we will plan for robotic assisted laparoscopic cholecystectomy with Dr. Lady Gary, tentatively tomorrow (04/09). If she has any worsening of her LFTs or hyperbilirubinemia, she will likely need MRCP.   - All risks, benefits, and alternatives to above procedure(s) were discussed with the patient and her daughters at bedside, all of their questions were answered to their expressed satisfaction, patient expresses she wishes to proceed, and informed consent was obtained.  - Okay for CLD tonight; NPO at midnight   - Monitor abdominal examination  - Pain control prn; antiemetics prn   - DVT prophylaxis; hold for OR  All of the above findings and recommendations were discussed with the patient and her daughters, and all of their questions were answered to their expressed satisfaction.  Thank you for the opportunity to participate in this patient's care.   -- Lynden Oxford, PA-C [facility] 12/22/2020, 9:40 AM [phone] M-F: 7am - 4pm  During my conversation with the patient, she revealed that she has had at least one other attack with similar symptoms, in September of last year. She is eager to proceed with surgery. I saw and evaluated the patient.  I agree with the above documentation, exam, and plan, which I have edited where appropriate. Duanne Guess  2:49 PM

## 2020-12-22 NOTE — Progress Notes (Signed)
Progress Note    Veronica Padilla  FKC:[id number] DOB: Aug 01, 1971  DOA: 12/21/2020 PCP: [facility]    Brief Narrative:     Medical records reviewed and are as summarized below:  Veronica Padilla is an 50 y.o. female with medical history significant for persistent menorrhagia unresponsive to conservative therapy status post laparoscopic supracervical hysterectomy and bilateral salpingectomy, status post cesarean C-section x3, oophorectomy, hypertension. She had right upper quadrant abdominal pain, that started Tuesday evening, that referred to her back.   Assessment/Plan:   Active Problems:   Essential hypertension   Truncal obesity   RUQ pain   Elevated LFTs  Right upper quadrant abdominal pain with elevated LFTs -Limited right upper quadrant abdominal ultrasound was read as heterogeneously increased hepatic parenchymal echogenicity may represent steatosis or other intrinsic hepatic cellular disease such as hepatitis.  No focal lesion.  Layering stones and sludge in the gallbladder without sonographic findings of acute cholecystitis.  No biliary dilatation. -Acute hepatitis panel negative -LFTs trending down - TSH normal -patient still very tender in RUQ -- will ask general surgery to see: ? Passed gall stones (has stone in GB) -keep NPO until after eval by GS  Hypertension  -hold HCTZ  obesity Body mass index is 31.99 kg/m.   Family Communication/Anticipated D/C date and plan/Code Status   DVT prophylaxis: Lovenox ordered. Code Status: Full Code.  Disposition Plan: Status is: Observation   Dispo: The patient is from: Home              Anticipated d/c is to: Home              Patient currently is not medically stable to d/c.-- pending GS eval for GB issues   Difficult to place patient No         Medical Consultants:   General surgery    Subjective:   Still with pain in RUQ with radiation to  back  Objective:    Vitals:   12/21/20 1830 12/21/20 2116 12/22/20 0442 12/22/20 0749  BP: (!) 148/95 (!) 170/90 135/75 135/82  Pulse: 98 97 90 79  Resp: 14 16 18 18   Temp:  97.7 F (36.5 C) 98.3 F (36.8 C) 98.2 F (36.8 C)  TempSrc:  Oral Oral Oral  SpO2: 99% 97% 97% 99%  Weight:  74.3 kg    Height:  5' (1.524 m)      Intake/Output Summary (Last 24 hours) at 12/22/2020 0958 Last data filed at 12/22/2020 0500 Gross per 24 hour  Intake 1000 ml  Output 300 ml  Net 700 ml   Filed Weights   12/21/20 1725 12/21/20 2116  Weight: 74.4 kg 74.3 kg    Exam:  General: Appearance:    Obese female in no acute distress   Tender to palpation in RUQ  Lungs:      respirations unlabored  Heart:    Normal heart rate. Normal rhythm. No murmurs, rubs, or gallops.   MS:   All extremities are intact.   Neurologic:   Awake, alert, oriented x 3. No apparent focal neurological           defect.     Data Reviewed:   I have personally reviewed following labs and imaging studies:  Labs: Labs show the following:   Basic Metabolic Panel: Recent Labs  Lab 12/21/20 1727 12/22/20 0535  NA 137 140  K 3.5 3.5  CL 101 105  CO2 26 27  GLUCOSE 114* 131*  BUN 8 6  CREATININE 0.50 0.63  CALCIUM 9.3 8.9   GFR Estimated Creatinine Clearance: 76.5 mL/min (by C-G formula based on SCr of 0.63 mg/dL). Liver Function Tests: Recent Labs  Lab 12/21/20 1727 12/22/20 0535  AST 735* 404*  ALT 1,210* 882*  ALKPHOS 194* 164*  BILITOT 3.3* 2.5*  PROT 8.2* 6.8  ALBUMIN 4.4 3.5   Recent Labs  Lab 12/21/20 1727  LIPASE 40   No results for input(s): AMMONIA in the last 168 hours. Coagulation profile Recent Labs  Lab 12/21/20 1926  INR 1.0    CBC: Recent Labs  Lab 12/21/20 1727 12/22/20 0535  WBC 5.6 4.1  HGB 14.9 13.6  HCT 42.6 38.5  MCV 88.4 87.7  PLT 217 179   Cardiac Enzymes: No results for input(s): CKTOTAL, CKMB, CKMBINDEX, TROPONINI in the last 168 hours. BNP (last 3  results) No results for input(s): PROBNP in the last 8760 hours. CBG: Recent Labs  Lab 12/21/20 2117 12/22/20 0746  GLUCAP 162* 130*   D-Dimer: No results for input(s): DDIMER in the last 72 hours. Hgb A1c: No results for input(s): HGBA1C in the last 72 hours. Lipid Profile: No results for input(s): CHOL, HDL, LDLCALC, TRIG, CHOLHDL, LDLDIRECT in the last 72 hours. Thyroid function studies: Recent Labs    12/22/20 0535  TSH 2.088   Anemia work up: No results for input(s): VITAMINB12, FOLATE, FERRITIN, TIBC, IRON, RETICCTPCT in the last 72 hours. Sepsis Labs: Recent Labs  Lab 12/21/20 1727 12/22/20 0535  WBC 5.6 4.1    Microbiology Recent Results (from the past 240 hour(s))  Resp Panel by RT-PCR (Flu A&B, Covid) Nasopharyngeal Swab     Status: None   Collection Time: 12/21/20  7:25 PM   Specimen: Nasopharyngeal Swab; Nasopharyngeal(NP) swabs in vial transport medium  Result Value Ref Range Status   SARS Coronavirus 2 by RT PCR NEGATIVE NEGATIVE Final    Comment: (NOTE) SARS-CoV-2 target nucleic acids are NOT DETECTED.  The SARS-CoV-2 RNA is generally detectable in upper respiratory specimens during the acute phase of infection. The lowest concentration of SARS-CoV-2 viral copies this assay can detect is 138 copies/mL. A negative result does not preclude SARS-Cov-2 infection and should not be used as the sole basis for treatment or other patient management decisions. A negative result may occur with  improper specimen collection/handling, submission of specimen other than nasopharyngeal swab, presence of viral mutation(s) within the areas targeted by this assay, and inadequate number of viral copies(<138 copies/mL). A negative result must be combined with clinical observations, patient history, and epidemiological information. The expected result is Negative.  Fact Sheet for Patients:  BloggerCourse.com  Fact Sheet for Healthcare  Providers:  SeriousBroker.it  This test is no t yet approved or cleared by the [place] FDA and  has been authorized for detection and/or diagnosis of SARS-CoV-2 by FDA under an Emergency Use Authorization (EUA). This EUA will remain  in effect (meaning this test can be used) for the duration of the COVID-19 declaration under Section 564(b)(1) of the Act, 21 U.S.C.section 360bbb-3(b)(1), unless the authorization is terminated  or revoked sooner.       Influenza A by PCR NEGATIVE NEGATIVE Final   Influenza B by PCR NEGATIVE NEGATIVE Final    Comment: (NOTE) The Xpert Xpress SARS-CoV-2/FLU/RSV plus assay is intended as an aid in the diagnosis of influenza from Nasopharyngeal swab specimens and should not be used as a sole basis for treatment. Nasal washings and  aspirates are unacceptable for Xpert Xpress SARS-CoV-2/FLU/RSV testing.  Fact Sheet for Patients: BloggerCourse.com  Fact Sheet for Healthcare Providers: SeriousBroker.it  This test is not yet approved or cleared by the [place] FDA and has been authorized for detection and/or diagnosis of SARS-CoV-2 by FDA under an Emergency Use Authorization (EUA). This EUA will remain in effect (meaning this test can be used) for the duration of the COVID-19 declaration under Section 564(b)(1) of the Act, 21 U.S.C. section 360bbb-3(b)(1), unless the authorization is terminated or revoked.  Performed at [facility], [address] [place], [place] [address]     Procedures and diagnostic studies:  US Abdomen Limited RUQ (LIVER/GB)  Result Date: 12/21/2020 CLINICAL DATA:  Hepatitis. EXAM: ULTRASOUND ABDOMEN LIMITED RIGHT UPPER QUADRANT COMPARISON:  Abdominal ultrasound 07/07/2012 FINDINGS: Gallbladder: Physiologically distended. Layering sludge and multiple stones within the gallbladder. No gallbladder wall thickening or pericholecystic  fluid. No sonographic Murphy sign noted by sonographer. Common bile duct: Diameter: 5 mm, normal Liver: No focal lesion identified. Heterogeneously increased in parenchymal echogenicity. Portal vein is patent on color Doppler imaging with normal direction of blood flow towards the liver. Other: No right upper quadrant ascites. IMPRESSION: 1. Heterogeneously increased hepatic parenchymal echogenicity may represent steatosis or other intrinsic hepatocellular disease such as hepatitis. No focal lesion. 2. Layering stones and sludge in the gallbladder without sonographic findings of acute cholecystitis. No biliary dilatation. Electronically Signed   By: Narda Rutherford M.D.   On: 12/21/2020 19:11    Medications:   . enoxaparin (LOVENOX) injection  40 mg Subcutaneous Q24H  . insulin aspart  0-15 Units Subcutaneous TID WC  . insulin aspart  0-5 Units Subcutaneous QHS   Continuous Infusions: . lactated ringers 75 mL/hr at 12/22/20 0838     LOS: 0 days   Joseph Art  [facility]   How to contact the [facility] Attending or Consulting provider 7A - 7P or covering provider during after hours 7P -7A, for this patient?  1. Check the care team in [facility] and look for a) attending/consulting TRH provider listed and b) the [facility] team listed 2. Log into [email or web address] and use [place]'s universal password to access. If you do not have the password, please contact the hospital operator. 3. Locate the [facility] provider you are looking for under [facility] and page to a number that you can be directly reached. 4. If you still have difficulty reaching the provider, please page the [facility] (Director on Call) for the Hospitalists listed on amion for assistance.  12/22/2020, 9:58 AM

## 2020-12-23 ENCOUNTER — Observation Stay: Payer: BC Managed Care – PPO | Admitting: Anesthesiology

## 2020-12-23 ENCOUNTER — Encounter
Admission: EM | Disposition: A | Discharge status: Home / Self Care | Payer: Self-pay | Source: Home / Self Care | Attending: Emergency Medicine

## 2020-12-23 DIAGNOSIS — R109 Unspecified abdominal pain: Secondary | ICD-10-CM | POA: Diagnosis not present

## 2020-12-23 DIAGNOSIS — R7989 Other specified abnormal findings of blood chemistry: Secondary | ICD-10-CM | POA: Diagnosis not present

## 2020-12-23 DIAGNOSIS — K8070 Calculus of gallbladder and bile duct without cholecystitis without obstruction: Secondary | ICD-10-CM

## 2020-12-23 DIAGNOSIS — E65 Localized adiposity: Secondary | ICD-10-CM | POA: Diagnosis not present

## 2020-12-23 DIAGNOSIS — K8010 Calculus of gallbladder with chronic cholecystitis without obstruction: Secondary | ICD-10-CM | POA: Diagnosis not present

## 2020-12-23 HISTORY — PX: ROBOTIC ASSISTED LAPAROSCOPIC CHOLECYSTECTOMY: SHX6521

## 2020-12-23 LAB — COMPREHENSIVE METABOLIC PANEL
ALT: 598 U/L — ABNORMAL HIGH (ref 0–44)
AST: 151 U/L — ABNORMAL HIGH (ref 15–41)
Albumin: 3.6 g/dL (ref 3.5–5.0)
Alkaline Phosphatase: 145 U/L — ABNORMAL HIGH (ref 38–126)
Anion gap: 8 (ref 5–15)
BUN: 7 mg/dL (ref 6–20)
CO2: 27 mmol/L (ref 22–32)
Calcium: 8.8 mg/dL — ABNORMAL LOW (ref 8.9–10.3)
Chloride: 104 mmol/L (ref 98–111)
Creatinine, Ser: 0.53 mg/dL (ref 0.44–1.00)
GFR, Estimated: >60 mL/min (ref >60)
Glucose, Bld: 122 mg/dL — ABNORMAL HIGH (ref 70–99)
Potassium: 3.8 mmol/L (ref 3.5–5.1)
Sodium: 139 mmol/L (ref 135–145)
Total Bilirubin: 1.6 mg/dL — ABNORMAL HIGH (ref 0.3–1.2)
Total Protein: 6.7 g/dL (ref 6.5–8.1)

## 2020-12-23 LAB — GLUCOSE, CAPILLARY
Glucose-Capillary: 111 mg/dL — ABNORMAL HIGH (ref 70–99)
Glucose-Capillary: 200 mg/dL — ABNORMAL HIGH (ref 70–99)
Glucose-Capillary: 174 mg/dL — ABNORMAL HIGH (ref 70–99)
Glucose-Capillary: 164 mg/dL — ABNORMAL HIGH (ref 70–99)
Glucose-Capillary: 144 mg/dL — ABNORMAL HIGH (ref 70–99)

## 2020-12-23 LAB — CBC
HCT: 37.9 % (ref 36.0–46.0)
Hemoglobin: 13.5 g/dL (ref 12.0–15.0)
MCH: 31 pg (ref 26.0–34.0)
MCHC: 35.6 g/dL (ref 30.0–36.0)
MCV: 86.9 fL (ref 80.0–100.0)
Platelets: 184 10*3/uL (ref 150–400)
RBC: 4.36 MIL/uL (ref 3.87–5.11)
RDW: 12.7 % (ref 11.5–15.5)
WBC: 5.2 10*3/uL (ref 4.0–10.5)
nRBC: 0 % (ref 0.0–0.2)

## 2020-12-23 SURGERY — CHOLECYSTECTOMY, ROBOT-ASSISTED, LAPAROSCOPIC
Anesthesia: General | Site: Abdomen

## 2020-12-23 MED ORDER — OXYCODONE HCL 5 MG/5ML PO SOLN: 5.0000 mg | Freq: Once | ORAL | Status: DC | PRN

## 2020-12-23 MED ORDER — ACETAMINOPHEN 10 MG/ML IV SOLN
INTRAVENOUS | Status: AC
  Filled 2020-12-23: qty 100

## 2020-12-23 MED ORDER — CEFAZOLIN SODIUM-DEXTROSE 2-3 GM-%(50ML) IV SOLR
INTRAVENOUS | Status: DC | PRN
  Administered 2020-12-23: 2 g via INTRAVENOUS

## 2020-12-23 MED ORDER — LACTATED RINGERS IV SOLN: INTRAVENOUS | Status: DC

## 2020-12-23 MED ORDER — BUPIVACAINE HCL (PF) 0.25 % IJ SOLN
INTRAMUSCULAR | Status: AC
  Filled 2020-12-23: qty 30

## 2020-12-23 MED ORDER — FENTANYL CITRATE (PF) 100 MCG/2ML IJ SOLN
INTRAMUSCULAR | Status: DC | PRN
  Administered 2020-12-23 (×3): 50 ug via INTRAVENOUS
  Administered 2020-12-23: 100 ug via INTRAVENOUS

## 2020-12-23 MED ORDER — SUGAMMADEX SODIUM 500 MG/5ML IV SOLN
INTRAVENOUS | Status: DC | PRN
  Administered 2020-12-23: 400 mg via INTRAVENOUS

## 2020-12-23 MED ORDER — ROCURONIUM BROMIDE 10 MG/ML (PF) SYRINGE
PREFILLED_SYRINGE | INTRAVENOUS | Status: AC
  Filled 2020-12-23: qty 10

## 2020-12-23 MED ORDER — LIDOCAINE HCL (PF) 2 % IJ SOLN
INTRAMUSCULAR | Status: AC
  Filled 2020-12-23: qty 5

## 2020-12-23 MED ORDER — SUGAMMADEX SODIUM 500 MG/5ML IV SOLN
INTRAVENOUS | Status: AC
  Filled 2020-12-23: qty 5

## 2020-12-23 MED ORDER — PROPOFOL 10 MG/ML IV BOLUS
INTRAVENOUS | Status: DC | PRN
  Administered 2020-12-23: 120 mg via INTRAVENOUS

## 2020-12-23 MED ORDER — LIDOCAINE HCL (CARDIAC) PF 100 MG/5ML IV SOSY
PREFILLED_SYRINGE | INTRAVENOUS | Status: DC | PRN
  Administered 2020-12-23: 80 mg via INTRAVENOUS

## 2020-12-23 MED ORDER — OXYCODONE HCL 5 MG PO TABS: 5.0000 mg | ORAL_TABLET | Freq: Once | ORAL | Status: DC | PRN

## 2020-12-23 MED ORDER — MIDAZOLAM HCL 2 MG/2ML IJ SOLN
INTRAMUSCULAR | Status: AC
  Filled 2020-12-23: qty 2

## 2020-12-23 MED ORDER — DEXMEDETOMIDINE (PRECEDEX) IN NS 20 MCG/5ML (4 MCG/ML) IV SYRINGE
PREFILLED_SYRINGE | INTRAVENOUS | Status: DC | PRN
  Administered 2020-12-23: 20 ug via INTRAVENOUS

## 2020-12-23 MED ORDER — ACETAMINOPHEN 10 MG/ML IV SOLN
INTRAVENOUS | Status: DC | PRN
  Administered 2020-12-23: 1000 mg via INTRAVENOUS

## 2020-12-23 MED ORDER — DEXAMETHASONE SODIUM PHOSPHATE 10 MG/ML IJ SOLN
INTRAMUSCULAR | Status: AC
  Filled 2020-12-23: qty 1

## 2020-12-23 MED ORDER — LIDOCAINE-EPINEPHRINE 1 %-1:100000 IJ SOLN
INTRAMUSCULAR | Status: AC
  Filled 2020-12-23: qty 1

## 2020-12-23 MED ORDER — MIDAZOLAM HCL 2 MG/2ML IJ SOLN
INTRAMUSCULAR | Status: DC | PRN
  Administered 2020-12-23: 2 mg via INTRAVENOUS

## 2020-12-23 MED ORDER — PROPOFOL 10 MG/ML IV BOLUS
INTRAVENOUS | Status: AC
  Filled 2020-12-23: qty 20

## 2020-12-23 MED ORDER — FENTANYL CITRATE (PF) 250 MCG/5ML IJ SOLN
INTRAMUSCULAR | Status: AC
  Filled 2020-12-23: qty 5

## 2020-12-23 MED ORDER — DEXAMETHASONE SODIUM PHOSPHATE 10 MG/ML IJ SOLN
INTRAMUSCULAR | Status: DC | PRN
  Administered 2020-12-23: 5 mg via INTRAVENOUS

## 2020-12-23 MED ORDER — ONDANSETRON HCL 4 MG/2ML IJ SOLN: 4.0000 mg | Freq: Once | INTRAMUSCULAR | Status: AC | PRN

## 2020-12-23 MED ORDER — FENTANYL CITRATE (PF) 100 MCG/2ML IJ SOLN
25.0000 ug | INTRAMUSCULAR | Status: DC | PRN
  Administered 2020-12-23: 25 ug via INTRAVENOUS

## 2020-12-23 MED ORDER — ONDANSETRON HCL 4 MG/2ML IJ SOLN
INTRAMUSCULAR | Status: AC
  Administered 2020-12-23: 4 mg via INTRAVENOUS
  Filled 2020-12-23: qty 2

## 2020-12-23 MED ORDER — LIDOCAINE-EPINEPHRINE 1 %-1:100000 IJ SOLN
INTRAMUSCULAR | Status: DC | PRN
  Administered 2020-12-23: 18 mL

## 2020-12-23 MED ORDER — ROCURONIUM BROMIDE 100 MG/10ML IV SOLN
INTRAVENOUS | Status: DC | PRN
  Administered 2020-12-23: 10 mg via INTRAVENOUS
  Administered 2020-12-23: 50 mg via INTRAVENOUS

## 2020-12-23 MED ORDER — ONDANSETRON HCL 4 MG/2ML IJ SOLN
INTRAMUSCULAR | Status: DC | PRN
  Administered 2020-12-23: 4 mg via INTRAVENOUS

## 2020-12-23 MED ORDER — ONDANSETRON HCL 4 MG/2ML IJ SOLN
INTRAMUSCULAR | Status: AC
  Filled 2020-12-23: qty 2

## 2020-12-23 MED ORDER — HYDROCODONE-ACETAMINOPHEN 5-325 MG PO TABS
1.0000 | ORAL_TABLET | ORAL | Status: DC | PRN
  Administered 2020-12-23 – 2020-12-24 (×3): 2 via ORAL
  Filled 2020-12-23 (×3): qty 2

## 2020-12-23 MED ORDER — LACTATED RINGERS IV SOLN: INTRAVENOUS | Status: DC | PRN

## 2020-12-23 SURGICAL SUPPLY — 57 items
BAG INFUSER PRESSURE 100CC (MISCELLANEOUS) ×4 IMPLANT
BLADE SURG SZ11 CARB STEEL (BLADE) ×2 IMPLANT
CANNULA REDUC XI 12-8 STAPL (CANNULA) ×1
CANNULA REDUCER 12-8 DVNC XI (CANNULA) ×1 IMPLANT
CHLORAPREP W/TINT 26 (MISCELLANEOUS) ×2 IMPLANT
CLIP VESOLOCK MED LG 6/CT (CLIP) ×2 IMPLANT
COVER TIP SHEARS 8 DVNC (MISCELLANEOUS) ×1 IMPLANT
COVER TIP SHEARS 8MM DA VINCI (MISCELLANEOUS) ×1
COVER WAND RF STERILE (DRAPES) ×2 IMPLANT
DECANTER SPIKE VIAL GLASS SM (MISCELLANEOUS) IMPLANT
DEFOGGER SCOPE WARMER CLEARIFY (MISCELLANEOUS) ×2 IMPLANT
DERMABOND ADVANCED (GAUZE/BANDAGES/DRESSINGS) ×1
DERMABOND ADVANCED .7 DNX12 (GAUZE/BANDAGES/DRESSINGS) ×1 IMPLANT
DRAPE ARM DVNC X/XI (DISPOSABLE) ×4 IMPLANT
DRAPE COLUMN DVNC XI (DISPOSABLE) ×1 IMPLANT
DRAPE DA VINCI XI ARM (DISPOSABLE) ×4
DRAPE DA VINCI XI COLUMN (DISPOSABLE) ×1
ELECT CAUTERY BLADE TIP 2.5 (TIP) ×2
ELECT REM PT RETURN 9FT ADLT (ELECTROSURGICAL) ×2
ELECTRODE CAUTERY BLDE TIP 2.5 (TIP) ×1 IMPLANT
ELECTRODE REM PT RTRN 9FT ADLT (ELECTROSURGICAL) ×1 IMPLANT
GLOVE SURG ENC MOIS LTX SZ6.5 (GLOVE) ×4 IMPLANT
GLOVE SURG UNDER LTX SZ7 (GLOVE) ×4 IMPLANT
GOWN STRL REUS W/ TWL LRG LVL3 (GOWN DISPOSABLE) ×3 IMPLANT
GOWN STRL REUS W/TWL LRG LVL3 (GOWN DISPOSABLE) ×3
GRASPER SUT TROCAR 14GX15 (MISCELLANEOUS) IMPLANT
IRRIGATOR SUCT 8 DISP DVNC XI (IRRIGATION / IRRIGATOR) ×1 IMPLANT
IRRIGATOR SUCTION 8MM XI DISP (IRRIGATION / IRRIGATOR) ×1
IV NS 1000ML (IV SOLUTION) ×1
IV NS 1000ML BAXH (IV SOLUTION) ×1 IMPLANT
KIT PINK PAD W/HEAD ARE REST (MISCELLANEOUS) ×2
KIT PINK PAD W/HEAD ARM REST (MISCELLANEOUS) ×1 IMPLANT
LABEL OR SOLS (LABEL) ×2 IMPLANT
MANIFOLD NEPTUNE II (INSTRUMENTS) ×2 IMPLANT
NEEDLE HYPO 22GX1.5 SAFETY (NEEDLE) ×2 IMPLANT
NEEDLE INSUFFLATION 14GA 120MM (NEEDLE) IMPLANT
NS IRRIG 500ML POUR BTL (IV SOLUTION) ×2 IMPLANT
OBTURATOR OPTICAL STANDARD 8MM (TROCAR) ×1
OBTURATOR OPTICAL STND 8 DVNC (TROCAR) ×1
OBTURATOR OPTICALSTD 8 DVNC (TROCAR) ×1 IMPLANT
PACK LAP CHOLECYSTECTOMY (MISCELLANEOUS) ×2 IMPLANT
PENCIL ELECTRO HAND CTR (MISCELLANEOUS) ×2 IMPLANT
POUCH SPECIMEN RETRIEVAL 10MM (ENDOMECHANICALS) ×2 IMPLANT
SEAL CANN UNIV 5-8 DVNC XI (MISCELLANEOUS) ×3 IMPLANT
SEAL XI 5MM-8MM UNIVERSAL (MISCELLANEOUS) ×3
SET TUBE SMOKE EVAC HIGH FLOW (TUBING) ×2 IMPLANT
SOLUTION ELECTROLUBE (MISCELLANEOUS) ×2 IMPLANT
STAPLER CANNULA SEAL DVNC XI (STAPLE) ×1 IMPLANT
STAPLER CANNULA SEAL XI (STAPLE) ×1
STRIP CLOSURE SKIN 1/2X4 (GAUZE/BANDAGES/DRESSINGS) ×2 IMPLANT
SUT MNCRL 4-0 (SUTURE) ×1
SUT MNCRL 4-0 27XMFL (SUTURE) ×1
SUT VIC AB 3-0 SH 27 (SUTURE)
SUT VIC AB 3-0 SH 27X BRD (SUTURE) IMPLANT
SUT VICRYL 0 AB UR-6 (SUTURE) IMPLANT
SUTURE MNCRL 4-0 27XMF (SUTURE) ×1 IMPLANT
TROCAR XCEL NON-BLD 5MMX100MML (ENDOMECHANICALS) IMPLANT

## 2020-12-23 NOTE — Progress Notes (Signed)
Progress Note    Veronica Padilla  DUK:[id number] DOB: [birth year]  DOA: 12/21/2020 [facility]: [facility]    Brief Narrative:     Medical records reviewed and are as summarized below:  Cleone Hulick is an 50 y.o. female with medical history significant for persistent menorrhagia unresponsive to conservative therapy status post laparoscopic supracervical hysterectomy and bilateral salpingectomy, status post cesarean C-section x3, oophorectomy, hypertension. She had right upper quadrant abdominal pain, that started Tuesday evening, that referred to her back.   Assessment/Plan:   Active Problems:   Essential hypertension   Truncal obesity   RUQ pain   Elevated LFTs  Right upper quadrant abdominal pain with elevated LFTs -Limited right upper quadrant abdominal ultrasound was read as heterogeneously increased hepatic parenchymal echogenicity may represent steatosis or other intrinsic hepatic cellular disease such as hepatitis.  No focal lesion.  Layering stones and sludge in the gallbladder without sonographic findings of acute cholecystitis.  No biliary dilatation. -Acute hepatitis panel negative -LFTs trending down - TSH normal -plan for GB removal today  Hypertension  -hold HCTZ  obesity Body mass index is 31.99 kg/m.   Family Communication/Anticipated D/C date and plan/Code Status   DVT prophylaxis: Lovenox ordered. Code Status: Full Code.  Disposition Plan: Status is: Observation Family at bedside  Dispo: The patient is from: Home              Anticipated d/c is to: Home              Patient currently is not medically stable to d/c.-- plan for OR today and home in AM??   Difficult to place patient No         Medical Consultants:   General surgery    Subjective:   Still with pain in RUQ with radiation to back  Objective:    Vitals:   12/22/20 2005 12/22/20 2313 12/23/20 0448 12/23/20 0735  BP: 135/84  137/84 133/73 135/80  Pulse: 78 83 78 71  Resp: 18 18 20 18   Temp: (!) 97.5 F (36.4 C) 97.8 F (36.6 C) 97.9 F (36.6 C) 97.7 F (36.5 C)  TempSrc:  Oral Oral Oral  SpO2: 98% 98% 98% 96%  Weight:      Height:        Intake/Output Summary (Last 24 hours) at 12/23/2020 0916 Last data filed at 12/23/2020 0656 Gross per 24 hour  Intake 2517.64 ml  Output 700 ml  Net 1817.64 ml   Filed Weights   12/21/20 1725 12/21/20 2116  Weight: 74.4 kg 74.3 kg    Exam:  General: Appearance:    Obese female in no acute distress     Lungs:     respirations unlabored  Heart:    Normal heart rate. Normal rhythm. No murmurs, rubs, or gallops.   MS:   All extremities are intact.   Neurologic:   Awake, alert, oriented x 3. No apparent focal neurological           defect.     Data Reviewed:   I have personally reviewed following labs and imaging studies:  Labs: Labs show the following:   Basic Metabolic Panel: Recent Labs  Lab 12/21/20 1727 12/22/20 0535 12/23/20 0500  NA 137 140 139  K 3.5 3.5 3.8  CL 101 105 104  CO2 26 27 27   GLUCOSE 114* 131* 122*  BUN 8 6 7   CREATININE 0.50 0.63 0.53  CALCIUM 9.3 8.9 8.8*  GFR Estimated Creatinine Clearance: 76.5 mL/min (by C-G formula based on SCr of 0.53 mg/dL). Liver Function Tests: Recent Labs  Lab 12/21/20 1727 12/22/20 0535 12/23/20 0500  AST 735* 404* 151*  ALT 1,210* 882* 598*  ALKPHOS 194* 164* 145*  BILITOT 3.3* 2.5* 1.6*  PROT 8.2* 6.8 6.7  ALBUMIN 4.4 3.5 3.6   Recent Labs  Lab 12/21/20 1727  LIPASE 40   No results for input(s): AMMONIA in the last 168 hours. Coagulation profile Recent Labs  Lab 12/21/20 1926  INR 1.0    CBC: Recent Labs  Lab 12/21/20 1727 12/22/20 0535 12/23/20 0500  WBC 5.6 4.1 5.2  HGB 14.9 13.6 13.5  HCT 42.6 38.5 37.9  MCV 88.4 87.7 86.9  PLT 217 179 184   Cardiac Enzymes: No results for input(s): CKTOTAL, CKMB, CKMBINDEX, TROPONINI in the last 168 hours. BNP (last 3  results) No results for input(s): PROBNP in the last 8760 hours. CBG: Recent Labs  Lab 12/22/20 0746 12/22/20 1203 12/22/20 1646 12/22/20 2104 12/23/20 0732  GLUCAP 130* 100* 90 96 111*   D-Dimer: No results for input(s): DDIMER in the last 72 hours. Hgb A1c: Recent Labs    12/22/20 0535  HGBA1C 5.8*   Lipid Profile: No results for input(s): CHOL, HDL, LDLCALC, TRIG, CHOLHDL, LDLDIRECT in the last 72 hours. Thyroid function studies: Recent Labs    12/22/20 0535  TSH 2.088   Anemia work up: No results for input(s): VITAMINB12, FOLATE, FERRITIN, TIBC, IRON, RETICCTPCT in the last 72 hours. Sepsis Labs: Recent Labs  Lab 12/21/20 1727 12/22/20 0535 12/23/20 0500  WBC 5.6 4.1 5.2    Microbiology Recent Results (from the past 240 hour(s))  Resp Panel by RT-PCR (Flu A&B, Covid) Nasopharyngeal Swab     Status: None   Collection Time: 12/21/20  7:25 PM   Specimen: Nasopharyngeal Swab; Nasopharyngeal(NP) swabs in vial transport medium  Result Value Ref Range Status   SARS Coronavirus 2 by RT PCR NEGATIVE NEGATIVE Final    Comment: (NOTE) SARS-CoV-2 target nucleic acids are NOT DETECTED.  The SARS-CoV-2 RNA is generally detectable in upper respiratory specimens during the acute phase of infection. The lowest concentration of SARS-CoV-2 viral copies this assay can detect is 138 copies/mL. A negative result does not preclude SARS-Cov-2 infection and should not be used as the sole basis for treatment or other patient management decisions. A negative result may occur with  improper specimen collection/handling, submission of specimen other than nasopharyngeal swab, presence of viral mutation(s) within the areas targeted by this assay, and inadequate number of viral copies(<138 copies/mL). A negative result must be combined with clinical observations, patient history, and epidemiological information. The expected result is Negative.  Fact Sheet for Patients:   BloggerCourse.com  Fact Sheet for Healthcare Providers:  SeriousBroker.it  This test is no t yet approved or cleared by the [place] FDA and  has been authorized for detection and/or diagnosis of SARS-CoV-2 by FDA under an Emergency Use Authorization (EUA). This EUA will remain  in effect (meaning this test can be used) for the duration of the COVID-19 declaration under Section 564(b)(1) of the Act, 21 U.S.C.section 360bbb-3(b)(1), unless the authorization is terminated  or revoked sooner.       Influenza A by PCR NEGATIVE NEGATIVE Final   Influenza B by PCR NEGATIVE NEGATIVE Final    Comment: (NOTE) The Xpert Xpress SARS-CoV-2/FLU/RSV plus assay is intended as an aid in the diagnosis of influenza from Nasopharyngeal swab specimens and should  not be used as a sole basis for treatment. Nasal washings and aspirates are unacceptable for Xpert Xpress SARS-CoV-2/FLU/RSV testing.  Fact Sheet for Patients: BloggerCourse.com  Fact Sheet for Healthcare Providers: SeriousBroker.it  This test is not yet approved or cleared by the [place] FDA and has been authorized for detection and/or diagnosis of SARS-CoV-2 by FDA under an Emergency Use Authorization (EUA). This EUA will remain in effect (meaning this test can be used) for the duration of the COVID-19 declaration under Section 564(b)(1) of the Act, 21 U.S.C. section 360bbb-3(b)(1), unless the authorization is terminated or revoked.  Performed at [facility], [address] [place], [place] [address]     Procedures and diagnostic studies:  US Abdomen Limited RUQ (LIVER/GB)  Result Date: 12/21/2020 CLINICAL DATA:  Hepatitis. EXAM: ULTRASOUND ABDOMEN LIMITED RIGHT UPPER QUADRANT COMPARISON:  Abdominal ultrasound 07/07/2012 FINDINGS: Gallbladder: Physiologically distended. Layering sludge and multiple stones  within the gallbladder. No gallbladder wall thickening or pericholecystic fluid. No sonographic Murphy sign noted by sonographer. Common bile duct: Diameter: 5 mm, normal Liver: No focal lesion identified. Heterogeneously increased in parenchymal echogenicity. Portal vein is patent on color Doppler imaging with normal direction of blood flow towards the liver. Other: No right upper quadrant ascites. IMPRESSION: 1. Heterogeneously increased hepatic parenchymal echogenicity may represent steatosis or other intrinsic hepatocellular disease such as hepatitis. No focal lesion. 2. Layering stones and sludge in the gallbladder without sonographic findings of acute cholecystitis. No biliary dilatation. Electronically Signed   By: Narda Rutherford M.D.   On: 12/21/2020 19:11    Medications:   . indocyanine green  7.5 mg Intravenous On Call to OR  . insulin aspart  0-15 Units Subcutaneous TID WC  . insulin aspart  0-5 Units Subcutaneous QHS   Continuous Infusions:    LOS: 0 days   Joseph Art  [facility]   How to contact the [facility] Attending or Consulting provider 7A - 7P or covering provider during after hours 7P -7A, for this patient?  1. Check the care team in [facility] and look for a) attending/consulting TRH provider listed and b) the [facility] team listed 2. Log into [email or web address] and use [place]'s universal password to access. If you do not have the password, please contact the hospital operator. 3. Locate the [facility] provider you are looking for under [facility] and page to a number that you can be directly reached. 4. If you still have difficulty reaching the provider, please page the [facility] (Director on Call) for the Hospitalists listed on amion for assistance.  12/23/2020, 9:16 AM

## 2020-12-23 NOTE — Transfer of Care (Signed)
Immediate Anesthesia Transfer of Care Note  Patient: Veronica Padilla  Procedure(s) Performed: XI ROBOTIC ASSISTED LAPAROSCOPIC CHOLECYSTECTOMY (N/A Abdomen)  Patient Location: PACU  Anesthesia Type:General  Level of Consciousness: oriented and drowsy  Airway & Oxygen Therapy: Patient Spontanous Breathing and Patient connected to face mask oxygen  Post-op Assessment: Report given to RN and Post -op Vital signs reviewed and stable  Post vital signs: Reviewed and stable  Last Vitals:  Vitals Value Taken Time  BP 131/72 12/23/20 1148  Temp 37.3 C 12/23/20 1148  Pulse 80 12/23/20 1154  Resp 22 12/23/20 1152  SpO2 99 % 12/23/20 1154  Vitals shown include unvalidated device data.  Last Pain:  Vitals:   12/23/20 0735  TempSrc: Oral  PainSc:          Complications: No complications documented.

## 2020-12-23 NOTE — Anesthesia Preprocedure Evaluation (Signed)
Anesthesia Evaluation  Patient identified by MRN, date of birth, ID band Patient awake  General Assessment Comment:Cholelithiasis. Has had multiple abdominal surgeries before (c-sections, hysterectomy)  Reviewed: Allergy & Precautions, NPO status , Patient's Chart, lab work & pertinent test results  History of Anesthesia Complications Negative for: history of anesthetic complications  Airway Mallampati: III  TM Distance: >3 FB Neck ROM: Full    Dental no notable dental hx. (+) Teeth Intact   Pulmonary neg pulmonary ROS, neg sleep apnea, neg COPD, Patient abstained from smoking.Not current smoker,    Pulmonary exam normal breath sounds clear to auscultation       Cardiovascular Exercise Tolerance: Good METShypertension, (-) CAD and (-) Past MI (-) dysrhythmias  Rhythm:Regular Rate:Normal - Systolic murmurs    Neuro/Psych negative neurological ROS  negative psych ROS   GI/Hepatic neg GERD  ,(+)     (-) substance abuse  ,   Endo/Other  neg diabetes  Renal/GU negative Renal ROS     Musculoskeletal   Abdominal   Peds  Hematology   Anesthesia Other Findings Past Medical History: No date: Hypertension  Reproductive/Obstetrics                             Anesthesia Physical Anesthesia Plan  ASA: II  Anesthesia Plan: General   Post-op Pain Management:    Induction: Intravenous  PONV Risk Score and Plan: 4 or greater and Ondansetron, Dexamethasone and Midazolam  Airway Management Planned: Oral ETT and Video Laryngoscope Planned  Additional Equipment: None  Intra-op Plan:   Post-operative Plan: Extubation in OR  Informed Consent: I have reviewed the patients History and Physical, chart, labs and discussed the procedure including the risks, benefits and alternatives for the proposed anesthesia with the patient or authorized representative who has indicated his/her understanding and  acceptance.     Dental advisory given and Interpreter used for interveiw (in-person spanish interpreter at bedside during entire consent)  Plan Discussed with: CRNA and Surgeon  Anesthesia Plan Comments: (Discussed risks of anesthesia with patient, including PONV, sore throat, lip/dental damage. Rare risks discussed as well, such as cardiorespiratory and neurological sequelae. Patient understands.)        Anesthesia Quick Evaluation

## 2020-12-23 NOTE — Op Note (Signed)
Operative note  Pre-operative Diagnosis: Cholelithiasis, transient choledocholithiasis  Post-operative Diagnosis: Same  Procedure: Robot assisted laparoscopic cholecystectomy  Surgeon: Duanne Guess, MD  Anesthesia: GETA  Findings: Normal biliary anatomy, possible gallbladder diverticulum, multiple small, darkly pigmented gallstones  Estimated Blood Loss: 5 cc       Specimens: Gallbladder           Complications: none immediately apparent  Procedure In Detail: The patient was seen again in the holding room. The benefits, complications, treatment options, and expected outcomes were discussed with the patient. The risks of bleeding, infection, recurrence of symptoms, failure to resolve symptoms, bile duct damage, bile duct leak, retained common bile duct stone, bowel injury, any of which could require further surgery and/or ERCP, stent, or papillotomy were reviewed with the patient. The likelihood of improving the patient's symptoms with return to their baseline status is good.  The patient and/or family concurred with the proposed plan, giving informed consent.  The patient was taken to operating room, identified and the procedure verified as laparoscopic cholecystectomy.  A time out was held and the above information confirmed.  Prior to the induction of general anesthesia, antibiotic prophylaxis was administered. VTE prophylaxis was in place. General endotracheal anesthesia was then administered and tolerated well. After the induction, the abdomen was prepped with Chloraprep and draped in the sterile fashion. The patient was positioned in the supine position.  Optiview technique was used to enter the abdomen in the right upper quadrant via a standard 5 mm laparoscopic trocar.  Pneumoperitoneum was then created with CO2 and tolerated well without any adverse changes in the patient's vital signs.  A 12 mm robotic trocar along with three 8-mm robotic trochars were placed under direct  vision.  All skin incisions were infiltrated with a local anesthetic agent before making the incision and placing the trocars.   The patient was positioned in 15 degrees of reverse Trendelenburg and tilted 10 degrees to the left.  The robot was brought to the surgical field and docked in the standard fashion.  We made sure all the instrumentation was kept in direct view at all times and that there were no collision between the arms.  I scrubbed out and went to the console.  The gallbladder was identified, the fundus grasped and retracted cephalad. Adhesions were lysed bluntly. The infundibulum was grasped and retracted laterally, exposing the peritoneum overlying the triangle of Calot. This was then divided and exposed in a blunt fashion. An extended critical view of the cystic duct and cystic artery was obtained.  The cystic duct was clearly identified and bluntly dissected away from the surrounding tissues, as was the cystic artery.  Using ICG cholangiography we visualized the cystic duct and confirmed that there was no aberrant biliary ductal anatomy nor any evidence of bile duct injury.  Both cystic duct and cystic artery were clipped and divided. The gallbladder was taken from the gallbladder fossa in a retrograde fashion with the electrocautery. Hemostasis was achieved with the electrocautery. Inspection of the right upper quadrant was performed. No bleeding, bile duct injury or leak, or bowel injury was noted.  The gallbladder was then placed in an Endopouch bag. The robotic instruments were removed and robotic arms were undocked in the standard fashion.    I scrubbed back in.  The gallbladder was removed via the 12 mm trocar site.  The fascial defects from his 12 mm site were offset due to the angle at which I placed the trocar and therefore I  did not elect to close them.  The remaining 8 mm ports were removed and pneumoperitoneum was released.  Each port site was closed with deep dermal 3-0  Vicryl.  4-0 subcuticular Monocryl was used to close the skin. Dermabond was applied, followed by Steri-Strips.  The patient was then awakened, extubated, and taken to the postanesthesia recovery unit in stable condition.   Sponge, lap, and needle counts were reported to be correct number at closure and at the conclusion of the case.          Duanne Guess, MD FACS

## 2020-12-23 NOTE — Anesthesia Postprocedure Evaluation (Signed)
Anesthesia Post Note  Patient: Veronica Padilla  Procedure(s) Performed: XI ROBOTIC ASSISTED LAPAROSCOPIC CHOLECYSTECTOMY (N/A Abdomen)  Patient location during evaluation: PACU Anesthesia Type: General Level of consciousness: awake and alert Pain management: pain level controlled Vital Signs Assessment: post-procedure vital signs reviewed and stable Respiratory status: spontaneous breathing, nonlabored ventilation, respiratory function stable and patient connected to nasal cannula oxygen Cardiovascular status: blood pressure returned to baseline and stable Postop Assessment: no apparent nausea or vomiting Anesthetic complications: no   No complications documented.   Last Vitals:  Vitals:   12/23/20 1323 12/23/20 1401  BP: (!) 147/84 132/70  Pulse: 81 80  Resp: 17   Temp: 36.5 C 36.4 C  SpO2: 97% 96%    Last Pain:  Vitals:   12/23/20 1410  TempSrc:   PainSc: 3                  Corinda Gubler

## 2020-12-23 NOTE — Anesthesia Procedure Notes (Signed)
Procedure Name: Intubation Date/Time: 12/23/2020 9:41 AM Performed by: Arita Miss, MD Pre-anesthesia Checklist: Patient identified, Patient being monitored, Timeout performed, Emergency Drugs available and Suction available Patient Re-evaluated:Patient Re-evaluated prior to induction Oxygen Delivery Method: Circle system utilized Preoxygenation: Pre-oxygenation with 100% oxygen Induction Type: IV induction Ventilation: Oral airway inserted - appropriate to patient size and Mask ventilation with difficulty Laryngoscope Size: Mac and 3 Grade View: Grade I Tube type: Oral Tube size: 7.0 mm Number of attempts: 1 Airway Equipment and Method: Stylet Placement Confirmation: ETT inserted through vocal cords under direct vision,  positive ETCO2 and breath sounds checked- equal and bilateral Secured at: 21 cm Tube secured with: Tape Dental Injury: Teeth and Oropharynx as per pre-operative assessment  Difficulty Due To: Difficulty was unanticipated

## 2020-12-24 DIAGNOSIS — R7989 Other specified abnormal findings of blood chemistry: Secondary | ICD-10-CM | POA: Diagnosis not present

## 2020-12-24 DIAGNOSIS — K8070 Calculus of gallbladder and bile duct without cholecystitis without obstruction: Secondary | ICD-10-CM | POA: Diagnosis not present

## 2020-12-24 DIAGNOSIS — E65 Localized adiposity: Secondary | ICD-10-CM | POA: Diagnosis not present

## 2020-12-24 LAB — COMPREHENSIVE METABOLIC PANEL
ALT: 457 U/L — ABNORMAL HIGH (ref 0–44)
AST: 110 U/L — ABNORMAL HIGH (ref 15–41)
Albumin: 3.3 g/dL — ABNORMAL LOW (ref 3.5–5.0)
Alkaline Phosphatase: 126 U/L (ref 38–126)
Anion gap: 7 (ref 5–15)
BUN: 7 mg/dL (ref 6–20)
CO2: 26 mmol/L (ref 22–32)
Calcium: 8.7 mg/dL — ABNORMAL LOW (ref 8.9–10.3)
Chloride: 106 mmol/L (ref 98–111)
Creatinine, Ser: 0.67 mg/dL (ref 0.44–1.00)
GFR, Estimated: >60 mL/min (ref >60)
Glucose, Bld: 111 mg/dL — ABNORMAL HIGH (ref 70–99)
Potassium: 3.8 mmol/L (ref 3.5–5.1)
Sodium: 139 mmol/L (ref 135–145)
Total Bilirubin: 1.1 mg/dL (ref 0.3–1.2)
Total Protein: 6.5 g/dL (ref 6.5–8.1)

## 2020-12-24 LAB — CBC
HCT: 37.2 % (ref 36.0–46.0)
Hemoglobin: 12.9 g/dL (ref 12.0–15.0)
MCH: 30.6 pg (ref 26.0–34.0)
MCHC: 34.7 g/dL (ref 30.0–36.0)
MCV: 88.4 fL (ref 80.0–100.0)
Platelets: 174 10*3/uL (ref 150–400)
RBC: 4.21 MIL/uL (ref 3.87–5.11)
RDW: 12.8 % (ref 11.5–15.5)
WBC: 11.9 10*3/uL — ABNORMAL HIGH (ref 4.0–10.5)
nRBC: 0 % (ref 0.0–0.2)

## 2020-12-24 LAB — GLUCOSE, CAPILLARY
Glucose-Capillary: 114 mg/dL — ABNORMAL HIGH (ref 70–99)
Glucose-Capillary: 120 mg/dL — ABNORMAL HIGH (ref 70–99)

## 2020-12-24 MED ORDER — IBUPROFEN 800 MG PO TABS: 800.0000 mg | ORAL_TABLET | Freq: Three times a day (TID) | ORAL | 0 refills | Status: DC | PRN

## 2020-12-24 MED ORDER — HYDROCODONE-ACETAMINOPHEN 5-325 MG PO TABS: 1.0000 | ORAL_TABLET | ORAL | 0 refills | Status: DC | PRN

## 2020-12-24 MED ORDER — HYDROCHLOROTHIAZIDE 12.5 MG PO CAPS: 12.5000 mg | ORAL_CAPSULE | Freq: Every day | ORAL | 0 refills | Status: AC

## 2020-12-24 NOTE — Discharge Summary (Signed)
Physician Discharge Summary  Margorie Padilla [id number] DOB: [birth year] DOA: 12/21/2020  PCP: [facility]  Admit date: 12/21/2020 Discharge date: 12/24/2020  Admitted From: home Discharge disposition: home   Recommendations for Outpatient Follow-Up:   1. Needs BP and blood sugars measured   Discharge Diagnosis:   Active Problems:   Essential hypertension   Truncal obesity   RUQ pain   Elevated LFTs   Calculus of gallbladder and bile duct without cholecystitis or obstruction    Discharge Condition: Improved.  Diet recommendation: Low sodium, heart healthy.  Carbohydrate-modified.   Wound care: None.  Code status: Full.   History of Present Illness:   Veronica Padilla is a 50 y.o. female with medical history significant for persistent menorrhagia unresponsive to conservative therapy status post laparoscopic supracervical hysterectomy and bilateral salpingectomy, status post cesarean C-section x3, oophorectomy, hypertension,  She had right upper quadrant abdominal pain, that started Tuesday evening, that referred to my back. She also noted dark yellow urine. She endorses intermittent dysuria. She has had pain like this before in January 6 or 7, 2022.  She states that at this time the pain is a 1/10 after the pain medication and on Tuesday, the pain was a 10/10. Prior to presentation to the emergency department today, the pain was a 5/10.  The describes the pain as persistent, takes her breath away, is uncomfortable with standing, laying down, and sitting. She endorses yellow loose stool this AM, 4x. She denies recent seafood ingestion or changes to her diet. She denies knowledge of noxious exposure.  She denies recent travel.  She endorses chills. She does not know if she had a fever.   She does not know if she has been vaccinated for hepatitis A or B. At bedside, she is able to tell me her full name, age, and her  location of hospital.   Hospital Course by Problem:   Right upper quadrant abdominal pain with elevated LFTs -Limited right upper quadrant abdominal ultrasound was read asheterogeneouslyincreased hepatic parenchymal echogenicity may represent steatosis or other intrinsic hepatic cellular disease such as hepatitis. No focal lesion. Layering stones and sludge in the gallbladder without sonographic findings of acute cholecystitis. No biliary dilatation. -Acute hepatitis panel negative -LFTs trending down - TSH normal -s/p GB removal by general surgery  Hypertension  -resume HCTZ  obesity Body mass index is 31.99 kg/m.    Medical Consultants:    General surgery  Discharge Exam:   Vitals:   12/24/20 0813 12/24/20 1128  BP: (!) 146/80 (!) 153/84  Pulse: 76 87  Resp: 18 16  Temp: 98.2 F (36.8 C) 98.2 F (36.8 C)  SpO2: 98% 97%   Vitals:   12/23/20 2024 12/24/20 0505 12/24/20 0813 12/24/20 1128  BP: 139/73 122/75 (!) 146/80 (!) 153/84  Pulse: 88 82 76 87  Resp: 16 16 18 16   Temp: 99.8 F (37.7 C) 98 F (36.7 C) 98.2 F (36.8 C) 98.2 F (36.8 C)  TempSrc: Oral Oral Oral Oral  SpO2: 96% 97% 98% 97%  Weight:      Height:        General exam: Appears calm and comfortable.  The results of significant diagnostics from this hospitalization (including imaging, microbiology, ancillary and laboratory) are listed below for reference.     Procedures and Diagnostic Studies:   Abdomen Limited RUQ (LIVER/GB)  Result Date: 12/21/2020 CLINICAL DATA:  Hepatitis. EXAM: ULTRASOUND ABDOMEN LIMITED RIGHT UPPER QUADRANT  COMPARISON:  Abdominal ultrasound 07/07/2012 FINDINGS: Gallbladder: Physiologically distended. Layering sludge and multiple stones within the gallbladder. No gallbladder wall thickening or pericholecystic fluid. No sonographic Murphy sign noted by sonographer. Common bile duct: Diameter: 5 mm, normal Liver: No focal lesion identified. Heterogeneously  increased in parenchymal echogenicity. Portal vein is patent on color Doppler imaging with normal direction of blood flow towards the liver. Other: No right upper quadrant ascites. IMPRESSION: 1. Heterogeneously increased hepatic parenchymal echogenicity may represent steatosis or other intrinsic hepatocellular disease such as hepatitis. No focal lesion. 2. Layering stones and sludge in the gallbladder without sonographic findings of acute cholecystitis. No biliary dilatation. Electronically Signed   By: Narda Rutherford M.D.   On: 12/21/2020 19:11     Labs:   Basic Metabolic Panel: Recent Labs  Lab 12/21/20 1727 12/22/20 0535 12/23/20 0500 12/24/20 0435  NA 137 140 139 139  K 3.5 3.5 3.8 3.8  CL 101 105 104 106  CO2 26 27 27 26   GLUCOSE 114* 131* 122* 111*  BUN 8 6 7 7   CREATININE 0.50 0.63 0.53 0.67  CALCIUM 9.3 8.9 8.8* 8.7*   GFR Estimated Creatinine Clearance: 76.5 mL/min (by C-G formula based on SCr of 0.67 mg/dL). Liver Function Tests: Recent Labs  Lab 12/21/20 1727 12/22/20 0535 12/23/20 0500 12/24/20 0435  AST 735* 404* 151* 110*  ALT 1,210* 882* 598* 457*  ALKPHOS 194* 164* 145* 126  BILITOT 3.3* 2.5* 1.6* 1.1  PROT 8.2* 6.8 6.7 6.5  ALBUMIN 4.4 3.5 3.6 3.3*   Recent Labs  Lab 12/21/20 1727  LIPASE 40   No results for input(s): AMMONIA in the last 168 hours. Coagulation profile Recent Labs  Lab 12/21/20 1926  INR 1.0    CBC: Recent Labs  Lab 12/21/20 1727 12/22/20 0535 12/23/20 0500 12/24/20 0435  WBC 5.6 4.1 5.2 11.9*  HGB 14.9 13.6 13.5 12.9  HCT 42.6 38.5 37.9 37.2  MCV 88.4 87.7 86.9 88.4  PLT 217 179 184 174   Cardiac Enzymes: No results for input(s): CKTOTAL, CKMB, CKMBINDEX, TROPONINI in the last 168 hours. BNP: Invalid input(s): POCBNP CBG: Recent Labs  Lab 12/23/20 1340 12/23/20 1635 12/23/20 2124 12/24/20 0729 12/24/20 1129  GLUCAP 174* 164* 144* 114* 120*   D-Dimer No results for input(s): DDIMER in the last 72  hours. Hgb A1c Recent Labs    12/22/20 0535  HGBA1C 5.8*   Lipid Profile No results for input(s): CHOL, HDL, LDLCALC, TRIG, CHOLHDL, LDLDIRECT in the last 72 hours. Thyroid function studies Recent Labs    12/22/20 0535  TSH 2.088   Anemia work up No results for input(s): VITAMINB12, FOLATE, FERRITIN, TIBC, IRON, RETICCTPCT in the last 72 hours. Microbiology Recent Results (from the past 240 hour(s))  Resp Panel by RT-PCR (Flu A&B, Covid) Nasopharyngeal Swab     Status: None   Collection Time: 12/21/20  7:25 PM   Specimen: Nasopharyngeal Swab; Nasopharyngeal(NP) swabs in vial transport medium  Result Value Ref Range Status   SARS Coronavirus 2 by RT PCR NEGATIVE NEGATIVE Final    Comment: (NOTE) SARS-CoV-2 target nucleic acids are NOT DETECTED.  The SARS-CoV-2 RNA is generally detectable in upper respiratory specimens during the acute phase of infection. The lowest concentration of SARS-CoV-2 viral copies this assay can detect is 138 copies/mL. A negative result does not preclude SARS-Cov-2 infection and should not be used as the sole basis for treatment or other patient management decisions. A negative result may occur with  improper specimen collection/handling,  submission of specimen other than nasopharyngeal swab, presence of viral mutation(s) within the areas targeted by this assay, and inadequate number of viral copies(<138 copies/mL). A negative result must be combined with clinical observations, patient history, and epidemiological information. The expected result is Negative.  Fact Sheet for Patients:  BloggerCourse.com  Fact Sheet for Healthcare Providers:  SeriousBroker.it  This test is no t yet approved or cleared by the [place] FDA and  has been authorized for detection and/or diagnosis of SARS-CoV-2 by FDA under an Emergency Use Authorization (EUA). This EUA will remain  in effect (meaning this test  can be used) for the duration of the COVID-19 declaration under Section 564(b)(1) of the Act, 21 U.S.C.section 360bbb-3(b)(1), unless the authorization is terminated  or revoked sooner.       Influenza A by PCR NEGATIVE NEGATIVE Final   Influenza B by PCR NEGATIVE NEGATIVE Final    Comment: (NOTE) The Xpert Xpress SARS-CoV-2/FLU/RSV plus assay is intended as an aid in the diagnosis of influenza from Nasopharyngeal swab specimens and should not be used as a sole basis for treatment. Nasal washings and aspirates are unacceptable for Xpert Xpress SARS-CoV-2/FLU/RSV testing.  Fact Sheet for Patients: BloggerCourse.com  Fact Sheet for Healthcare Providers: SeriousBroker.it  This test is not yet approved or cleared by the [place] FDA and has been authorized for detection and/or diagnosis of SARS-CoV-2 by FDA under an Emergency Use Authorization (EUA). This EUA will remain in effect (meaning this test can be used) for the duration of the COVID-19 declaration under Section 564(b)(1) of the Act, 21 U.S.C. section 360bbb-3(b)(1), unless the authorization is terminated or revoked.  Performed at [facility], [address], [place], [place] [address]      Discharge Instructions:   Discharge Instructions    Diet - low sodium heart healthy   Complete by: As directed    Diet Carb Modified   Complete by: As directed    Increase activity slowly   Complete by: As directed    No dressing needed   Complete by: As directed      Allergies as of 12/24/2020   No Known Allergies     Medication List    TAKE these medications   hydrochlorothiazide 12.5 MG capsule Commonly known as: Microzide Take 1 capsule (12.5 mg total) by mouth daily.            Discharge Care Instructions  (From admission, onward)         Start     Ordered   12/24/20 0000  No dressing needed        12/24/20 1132          Follow-up  Information    [facility] Follow up.   Specialty: General Practice Why: you will need to have your BP and blood sugar monitored closely Contact information: [address] [place] [address] [phone]                Time coordinating discharge: 35 min  Signed:  Joseph Art DO  [facility] 12/24/2020, 11:35 AM

## 2020-12-24 NOTE — Progress Notes (Signed)
Patient sitting up eating breakfast.  No overnight events.  Hope for d/c this AM after seen by General Surgery. Marlin Canary DO

## 2020-12-24 NOTE — Progress Notes (Signed)
Patient and husband given instructions with Ignacia Palma [phone] to make 2 week appointment with Dr. Lady Gary, when to return for worsening symptoms, IV taken out, Diabetes education given, & patient has a prescription to  Pick up at the pharmacy.

## 2020-12-24 NOTE — Discharge Instructions (Signed)
   Dolor abdominal en los adultos Abdominal Pain, Adult El dolor en el abdomen (dolor abdominal) puede tener muchas causas. A menudo, el dolor abdominal no es grave y [place] sin tratamiento o con tratamiento en la casa. Sin embargo, a Facilities manager abdominal es grave. El mdico le har preguntas sobre sus antecedentes mdicos y le har un examen fsico para tratar de Production assistant, radio causa del dolor abdominal. Siga estas instrucciones en su casa: Medicamentos  Use los medicamentos de venta libre y los recetados solamente como se lo haya indicado el mdico.  No tome un laxante a menos que se lo haya indicado el mdico. Instrucciones generales  Controle su afeccin para detectar cualquier cambio.  Beba suficiente lquido como para Pharmacologist la orina de color amarillo plido.  Concurra a todas las visitas de [address] se lo haya indicado el mdico. Esto es importante.   Comunquese con un mdico si:  El dolor abdominal cambia o empeora.  No tiene apetito o baja de peso sin proponrselo.  Est estreido o tiene diarrea durante ms de 2 o 3das.  Tiene dolor cuando orina o defeca.  El dolor abdominal lo despierta de noche.  El dolor empeora con las comidas, despus de comer o con determinados alimentos.  Tiene vmitos y no puede retener nada de lo que ingiere.  Tiene fiebre.  Observa sangre en la orina. Solicite ayuda inmediatamente si:  El dolor no desaparece tan pronto como el mdico le dijo que era esperable.  No puede dejar de vomitar.  El Engineer, mining se siente solo en zonas del abdomen, como el lado derecho o la parte inferior izquierda del abdomen. Si se localiza en la zona derecha, posiblemente podra tratarse de apendicitis.  Las heces son sanguinolentas o de color negro, o de aspecto alquitranado.  Tiene dolor intenso, clicos o distensin abdominal.  Tiene signos de deshidratacin, como los siguientes: ? Orina de color oscuro, muy escasa o falta de  orina. ? Labios agrietados. ? Sequedad de boca. ? Ojos hundidos. ? Somnolencia. ? Debilidad.  Tiene dificultad para respirar o Journalist, newspaper. Resumen  A menudo, el dolor abdominal no es grave y [place] sin tratamiento o con tratamiento en la casa. Sin embargo, a Facilities manager abdominal es grave.  Controle su afeccin para Insurance risk surveyor cambio.  Use los medicamentos de venta libre y los recetados solamente como se lo haya indicado el mdico.  Comunquese con un mdico si el dolor abdominal cambia o [place].  Busque ayuda de inmediato si tiene dolor intenso, clicos o distensin abdominal. Esta informacin no tiene Theme park manager el consejo del mdico. Asegrese de hacerle al mdico cualquier pregunta que tenga. Document Revised: 03/10/2019 Document Reviewed: 03/10/2019 Elsevier Patient Education  2021 Elsevier Inc. You are not yet a DM based on your HgbA1c but you need to make diet changes to prevent becoming one

## 2020-12-24 NOTE — Progress Notes (Signed)
S: Patient is postop day 1 from robot-assisted laparoscopic cholecystectomy.  Today, she states that she is still sore in her right upper quadrant, but this is improved from admission.  She denies any nausea or vomiting.  She does state that she feels subjectively warm, but has not had any chills.  She tolerated her breakfast this morning.  O: Today's Vitals   12/24/20 0741 12/24/20 0813 12/24/20 1128 12/24/20 1155  BP:  (!) 146/80 (!) 153/84   Pulse:  76 87   Resp:  18 16   Temp:  98.2 F (36.8 C) 98.2 F (36.8 C)   TempSrc:  Oral Oral   SpO2:  98% 97%   Weight:      Height:      PainSc: 0-No pain   10-Worst pain ever   Body mass index is 31.99 kg/m. General: She is alert and oriented, no acute distress Pulmonary: Normal work of breathing on room air Cardiovascular: Regular rate and rhythm Abdomen: Soft, appropriately tender, nondistended.  Robotic trocar sites with Steri-Strips in place.  No erythema, induration, or drainage.  Results for Veronica Padilla, Veronica Padilla (MRN [id number]) as of 12/24/2020 13:09  Ref. Range 12/24/2020 04:35  Sodium Latest Ref Range: 135 - 145 mmol/L 139  Potassium Latest Ref Range: 3.5 - 5.1 mmol/L 3.8  Chloride Latest Ref Range: 98 - 111 mmol/L 106  CO2 Latest Ref Range: 22 - 32 mmol/L 26  Glucose Latest Ref Range: 70 - 99 mg/dL 921 (H)  BUN Latest Ref Range: 6 - 20 mg/dL 7  Creatinine Latest Ref Range: 0.44 - 1.00 mg/dL 1.94  Calcium Latest Ref Range: 8.9 - 10.3 mg/dL 8.7 (L)  Anion gap Latest Ref Range: 5 - 15  7  Alkaline Phosphatase Latest Ref Range: 38 - 126 U/L 126  Albumin Latest Ref Range: 3.5 - 5.0 g/dL 3.3 (L)  AST Latest Ref Range: 15 - 41 U/L 110 (H)  ALT Latest Ref Range: 0 - 44 U/L 457 (H)  Total Protein Latest Ref Range: 6.5 - 8.1 g/dL 6.5  Total Bilirubin Latest Ref Range: 0.3 - 1.2 mg/dL 1.1  GFR, Estimated Latest Ref Range: >60 mL/min >60  WBC Latest Ref Range: 4.0 - 10.5 K/uL 11.9 (H)  RBC Latest Ref Range: 3.87 - 5.11 MIL/uL  4.21  Hemoglobin Latest Ref Range: 12.0 - 15.0 g/dL 17.4  HCT Latest Ref Range: 36.0 - 46.0 % 37.2  MCV Latest Ref Range: 80.0 - 100.0 fL 88.4  MCH Latest Ref Range: 26.0 - 34.0 pg 30.6  MCHC Latest Ref Range: 30.0 - 36.0 g/dL 08.1  RDW Latest Ref Range: 11.5 - 15.5 % 12.8  Platelets Latest Ref Range: 150 - 400 K/uL 174  nRBC Latest Ref Range: 0.0 - 0.2 % 0.0   Leukocytosis likely reactive.  Transaminase elevation also likely secondary to surgery.  Bilirubin continues to improve.    Impression and plan: This is a 50 year old woman stop day 1 from robot-assisted laparoscopic cholecystectomy.  She is doing well.  From a surgical standpoint, there are no barriers to her discharge.  I would like to see her back in my clinic in 2 weeks.  I have placed a prescription for pain medication in her chart electronically.  She may send her FMLA paperwork for her job to my office and we will complete it for her.

## 2020-12-26 LAB — SURGICAL PATHOLOGY

## 2020-12-27 ENCOUNTER — Telehealth: Payer: Self-pay | Admitting: *Deleted

## 2020-12-27 NOTE — Telephone Encounter (Signed)
Faxed FMLA to [name] at [phone]

## 2021-01-09 ENCOUNTER — Ambulatory Visit (INDEPENDENT_AMBULATORY_CARE_PROVIDER_SITE_OTHER): Payer: BC Managed Care – PPO | Admitting: General Surgery

## 2021-01-09 ENCOUNTER — Other Ambulatory Visit: Payer: Self-pay

## 2021-01-09 ENCOUNTER — Encounter: Payer: Self-pay | Admitting: General Surgery

## 2021-01-09 VITALS — BP 150/100 | HR 112 | Temp 98.9°F | Ht 60.0 in | Wt 161.2 lb

## 2021-01-09 DIAGNOSIS — Z9049 Acquired absence of other specified parts of digestive tract: Secondary | ICD-10-CM

## 2021-01-09 NOTE — Progress Notes (Signed)
Veronica Padilla is here today for a postoperative visit.  She is accompanied by her daughter and a Spanish language interpreter.  She underwent a robot-assisted laparoscopic cholecystectomy on December 23, 2020.  She states that she has been doing well since her operation.  She denies any nausea or vomiting, but she has been having postprandial diarrhea.  She says that her pain has improved significantly, but she does have some discomfort in her right upper quadrant and she describes this as "feeling swollen."  She also reports that her appetite has not been quite as robust, but that she is eating adequate amounts.  Today's Vitals   01/09/21 1354  BP: (!) 150/100  Pulse: (!) 112  Temp: 98.9 F (37.2 C)  TempSrc: Oral  SpO2: 95%  Weight: 161 lb 3.2 oz (73.1 kg)  Height: 5' (1.524 m)   Body mass index is 31.48 kg/m. Focused abdominal exam: The laparoscopic trocar sites are still covered with Steri-Strips.  These were removed to reveal a well approximated incisions with Dermabond in place.  There is no erythema, induration, or drainage.  I examined the area of her abdomen that she describes as swollen.  I do not appreciate anything obvious on exam.  Impression and plan: This is a 50 year old woman who had a robot-assisted laparoscopic cholecystectomy for transient choledocholithiasis.  She is doing well from her surgery.  I reassured her that the area that seems swollen to her likely just represents inflammation from the operation.  I also discussed postcholecystectomy diarrhea and avoidance of fried or fatty foods.  I also reassured her that this was likely to resolve with time, but that she could take some over-the-counter agents such as Pepto-Bismol or Imodium to try and alleviate this.  Due to her employment and requirements for heavy lifting, she did ask for an additional 2 weeks off from work which we provided.  She also asked if she could safely go on a planned vacation to [place].  I told her  that this was certainly acceptable and that she should enjoy her time there.  I will see her on an as-needed basis.

## 2021-01-09 NOTE — Patient Instructions (Addendum)
Please call if you have questions or concerns.   GENERAL POST-OPERATIVE PATIENT INSTRUCTIONS   WOUND CARE INSTRUCTIONS:  Keep a dry clean dressing on the wound if there is drainage. The initial bandage may be removed after 24 hours.  Once the wound has quit draining you may leave it open to air.  If clothing rubs against the wound or causes irritation and the wound is not draining you may cover it with a dry dressing during the daytime.  Try to keep the wound dry and avoid ointments on the wound unless directed to do so.  If the wound becomes bright red and painful or starts to drain infected material that is not clear, please contact your physician immediately.  If the wound is mildly pink and has a thick firm ridge underneath it, this is normal, and is referred to as a healing ridge.  This will resolve over the next 4-6 weeks.  BATHING: You may shower if you have been informed of this by your surgeon. However, Please do not submerge in a tub, hot tub, or pool until incisions are completely sealed or have been told by your surgeon that you may do so.  DIET:  You may eat any foods that you can tolerate.  It is a good idea to eat a high fiber diet and take in plenty of fluids to prevent constipation.  If you do become constipated you may want to take a mild laxative or take ducolax tablets on a daily basis until your bowel habits are regular.  Constipation can be very uncomfortable, along with straining, after recent surgery.  ACTIVITY:  You are encouraged to cough and deep breath or use your incentive spirometer if you were given one, every 15-30 minutes when awake.  This will help prevent respiratory complications and low grade fevers post-operatively if you had a general anesthetic.  You may want to hug a pillow when coughing and sneezing to add additional support to the surgical area, if you had abdominal or chest surgery, which will decrease pain during these times.  You are encouraged to walk and  engage in light activity for the next two weeks.  You should not lift more than 10-15  pounds, until 01/13/2021  as it could put you at increased risk for complications.  Twenty pounds is roughly equivalent to a plastic bag of groceries. At that time- Listen to your body when lifting, if you have pain when lifting, stop and then try again in a few days. Soreness after doing exercises or activities of daily living is normal as you get back in to your normal routine.  MEDICATIONS:  Try to take narcotic medications and anti-inflammatory medications, such as tylenol, ibuprofen, naprosyn, etc., with food.  This will minimize stomach upset from the medication.  Should you develop nausea and vomiting from the pain medication, or develop a rash, please discontinue the medication and contact your physician.  You should not drive, make important decisions, or operate machinery when taking narcotic pain medication.  SUNBLOCK Use sun block to incision area over the next year if this area will be exposed to sun. This helps decrease scarring and will allow you avoid a permanent darkened area over your incision.  QUESTIONS:  Please feel free to call our office if you have any questions, and we will be glad to assist you. [phone]

## 2021-01-29 ENCOUNTER — Telehealth: Payer: Self-pay

## 2021-01-29 NOTE — Telephone Encounter (Signed)
[facility] request for records faxed to [phone].

## 2021-06-15 ENCOUNTER — Encounter: Payer: Self-pay | Admitting: General Surgery

## 2021-10-19 ENCOUNTER — Other Ambulatory Visit
Admission: RE | Admit: 2021-10-19 | Discharge: 2021-10-19 | Disposition: A | Discharge status: Home / Self Care | Payer: BC Managed Care – PPO | Attending: Pulmonary Disease | Admitting: Pulmonary Disease

## 2021-10-19 ENCOUNTER — Other Ambulatory Visit: Payer: Self-pay

## 2021-10-19 ENCOUNTER — Ambulatory Visit: Payer: BC Managed Care – PPO | Admitting: Pulmonary Disease

## 2021-10-19 ENCOUNTER — Encounter: Payer: Self-pay | Admitting: Pulmonary Disease

## 2021-10-19 VITALS — BP 118/80 | HR 91 | Temp 96.6°F | Ht 60.0 in | Wt 168.8 lb

## 2021-10-19 DIAGNOSIS — J45909 Unspecified asthma, uncomplicated: Secondary | ICD-10-CM

## 2021-10-19 DIAGNOSIS — R059 Cough, unspecified: Secondary | ICD-10-CM | POA: Insufficient documentation

## 2021-10-19 DIAGNOSIS — K219 Gastro-esophageal reflux disease without esophagitis: Secondary | ICD-10-CM | POA: Diagnosis not present

## 2021-10-19 DIAGNOSIS — R053 Chronic cough: Secondary | ICD-10-CM | POA: Diagnosis not present

## 2021-10-19 LAB — CBC WITH DIFFERENTIAL/PLATELET
Abs Immature Granulocytes: 0.14 10*3/uL — ABNORMAL HIGH (ref 0.00–0.07)
Basophils Absolute: 0.1 10*3/uL (ref 0.0–0.1)
Basophils Relative: 1 %
Eosinophils Absolute: 0.1 10*3/uL (ref 0.0–0.5)
Eosinophils Relative: 1 %
HCT: 41.7 % (ref 36.0–46.0)
Hemoglobin: 14.6 g/dL (ref 12.0–15.0)
Immature Granulocytes: 1 %
Lymphocytes Relative: 29 %
Lymphs Abs: 3.8 10*3/uL (ref 0.7–4.0)
MCH: 31.1 pg (ref 26.0–34.0)
MCHC: 35 g/dL (ref 30.0–36.0)
MCV: 88.7 fL (ref 80.0–100.0)
Monocytes Absolute: 1.1 10*3/uL — ABNORMAL HIGH (ref 0.1–1.0)
Monocytes Relative: 9 %
Neutro Abs: 7.8 10*3/uL — ABNORMAL HIGH (ref 1.7–7.7)
Neutrophils Relative %: 59 %
Platelets: 238 10*3/uL (ref 150–400)
RBC: 4.7 MIL/uL (ref 3.87–5.11)
RDW: 12.5 % (ref 11.5–15.5)
WBC: 13 10*3/uL — ABNORMAL HIGH (ref 4.0–10.5)
nRBC: 0 % (ref 0.0–0.2)

## 2021-10-19 MED ORDER — ESOMEPRAZOLE MAGNESIUM 40 MG PO CPDR: 40.0000 mg | DELAYED_RELEASE_CAPSULE | Freq: Every day | ORAL | 3 refills | Status: DC

## 2021-10-19 NOTE — Patient Instructions (Signed)
For the time being continue using your current inhalers.  We are going to get breathing tests that will let me know if I need to make your inhaler stronger.   I have written a prescription for Nexium (esomeprazole) this will help with your GERD symptoms which can make your asthma worse.   You are going to check a blood test for allergies.   We will see you back in 4 to 6 weeks time call sooner should any new problems arise.

## 2021-10-19 NOTE — Progress Notes (Signed)
Subjective:    Patient ID: Veronica Padilla, female    DOB: [birth year], 51 y.o.   MRN: [id number] Chief Complaint  Patient presents with   Consult    Cough, wheezing query asthma.   HPI Is a 51 year old Hispanic woman, lifelong never smoker, who presents for evaluation of episodes of cough, wheezing and chest tightness that recur every year.  She is kindly referred by Dr. Hillery AldoSarah Patel.  She presents with her daughter today.  I am able to communicate with the patient in her native language.  She states that she has been having this issue with recurrent bronchitis for the last 8 to 9 years worse after COVID 19 in July 2022.  Episodes occur usually in the fall of the year.  She does not usually have springtime exacerbations.  She states that she had COVID-19 in January 2021 and July 2022.  After her July episode she did develop issues with wheezing and was diagnosed with asthma.  She is currently on as needed albuterol and Flovent 110 mcg 2 puffs twice a day.  She notes that since addition of the Flovent she has done somewhat better.  This was added in December 2022.  She also had Singulair added to her regimen at that time and she feels that this is helpful.  She also notes that Zyrtec helps with "allergies" which also helps her with her respiratory symptoms.  She has had issues with gastroesophageal reflux that have been somewhat better since she had cholecystectomy on 23 December 2020.  She however continues to have difficulties with reflux and sometimes gets a sore throat associated with it and a sour taste in the back of her throat.  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  Exposed to textile fibers during her work.  [facility] which was normal.   Review of Systems A 10 point review of systems was performed and it is as noted above otherwise negative.  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   Fluticasone Propionate, Inhal, (FLOVENT IN) Inhale into the lungs.   hydrochlorothiazide (MICROZIDE) 12.5 MG capsule Take 1 capsule (12.5 mg total) by mouth daily.   [DISCONTINUED] HYDROcodone-acetaminophen (NORCO/VICODIN) 5-325 MG tablet Take 1 tablet by mouth every 4 (four) hours as needed for moderate pain.   [DISCONTINUED] ibuprofen (ADVIL) 800 MG tablet Take 1 tablet (800 mg total) by mouth every 8 (eight) hours as needed.    There is no immunization history on  file for this patient.     Objective:   Physical Exam BP 118/80 (BP Location: Left Arm, Patient Position: Sitting, Cuff Size: Normal)    Pulse 91    Temp (!) 96.6 F (35.9 C) (Oral)    Ht 5' (1.524 m)    Wt 168 lb 12.8 oz (76.6 kg)    SpO2 98%    BMI 32.97 kg/m  GENERAL: Obese woman, no acute distress, fully ambulatory, no conversational dyspnea. HEAD: Normocephalic, atraumatic.  EYES: Pupils equal, round, reactive to light.  No scleral icterus.  MOUTH: Nose/mouth/throat not examined due to masking requirements for COVID 19. NECK: Supple. No thyromegaly. Trachea midline. No JVD.   No adenopathy. PULMONARY: Good air entry bilaterally.  No adventitious sounds. CARDIOVASCULAR: S1 and S2. Regular rate and rhythm.  No rubs, murmurs or gallops heard. ABDOMEN: Obese otherwise benign MUSCULOSKELETAL: No joint deformity, no clubbing, no edema.  NEUROLOGIC: No focal deficit, no gait disturbance, speech is fluent. SKIN: Intact,warm,dry. PSYCH: Mood and behavior normal.     Assessment & Plan:     ICD-10-CM   1. Chronic cough  R05.3 Allergen Panel (27) + IGE    Pulmonary Function Test [facility] Only    CBC with Differential/Platelet   Suspect multifactorial Asthmatic bronchitis with possible cough variant asthma Gastroesophageal reflux with LPR See below    2. Asthmatic bronchitis without complication, unspecified asthma severity, unspecified whether persistent  J45.909    Will obtain PFTs Continue Flovent, albuterol and Singulair for now Follow-up 4 to 6 weeks    3. Chronic GERD  K21.9    Nexium 40 mg daily Antireflux measures This issue adds complexity to her management     Orders Placed This Encounter  Procedures   Allergen Panel (27) + IGE    Standing Status:   Future    Number of Occurrences:   1    Standing Expiration Date:   10/19/2022   CBC with Differential/Platelet    Standing Status:   Future    Number of Occurrences:   1    Standing Expiration Date:   10/19/2022   Pulmonary Function Test [facility] Only    Standing Status:   Future    Standing Expiration Date:   10/19/2022    Order Specific Question:   Full PFT: includes the following: basic spirometry, spirometry pre & post bronchodilator, diffusion capacity (DLCO), lung volumes    Answer:   Full PFT    Order Specific Question:   This test can only be performed at    Answer:   [facility]   Meds ordered this encounter  Medications   esomeprazole (NEXIUM) 40 MG capsule    Sig: Take 1 capsule (40 mg total) by mouth daily at 12 noon.    Dispense:  30 capsule    Refill:  3   For the time being we will  continue the same inhalers until PFTs are known.  We may need to make some changes to her medications.  It appears that she is still quite symptomatic with regards to reflux and we have instructed her on antireflux measures as well as we will give her a trial of Nexium 40 mg daily to see if this helps in this regard.  We will see the patient in follow-up in 4 to 6 weeks time she is to contact us prior to that time should any new difficulties arise.   Gailen Shelter, MD [facility]    *This note was  dictated using voice recognition software/Dragon.  Despite best efforts to proofread, errors can occur which can change the meaning. Any transcriptional errors that result from this process are unintentional and may not be fully corrected at the time of dictation.

## 2021-10-25 ENCOUNTER — Other Ambulatory Visit: Payer: Self-pay

## 2021-10-25 ENCOUNTER — Other Ambulatory Visit
Admission: RE | Admit: 2021-10-25 | Discharge: 2021-10-25 | Disposition: A | Discharge status: Home / Self Care | Payer: BC Managed Care – PPO | Source: Ambulatory Visit | Attending: Pulmonary Disease | Admitting: Pulmonary Disease

## 2021-10-25 DIAGNOSIS — B179 Acute viral hepatitis, unspecified: Secondary | ICD-10-CM

## 2021-10-25 DIAGNOSIS — Z20822 Contact with and (suspected) exposure to covid-19: Secondary | ICD-10-CM | POA: Insufficient documentation

## 2021-10-25 DIAGNOSIS — Z01812 Encounter for preprocedural laboratory examination: Secondary | ICD-10-CM | POA: Insufficient documentation

## 2021-10-25 DIAGNOSIS — K8070 Calculus of gallbladder and bile duct without cholecystitis without obstruction: Secondary | ICD-10-CM

## 2021-10-25 DIAGNOSIS — E65 Localized adiposity: Secondary | ICD-10-CM

## 2021-10-25 DIAGNOSIS — R7989 Other specified abnormal findings of blood chemistry: Secondary | ICD-10-CM

## 2021-10-25 DIAGNOSIS — I10 Essential (primary) hypertension: Secondary | ICD-10-CM

## 2021-10-25 DIAGNOSIS — R1011 Right upper quadrant pain: Secondary | ICD-10-CM

## 2021-10-25 LAB — ALLERGEN PANEL (27) + IGE
Alternaria Alternata IgE: <0.1 kU/L
Aspergillus Fumigatus IgE: <0.1 kU/L
Bahia Grass IgE: <0.1 kU/L
Bermuda Grass IgE: <0.1 kU/L
Cat Dander IgE: <0.1 kU/L
Cedar, Mountain IgE: <0.1 kU/L
Cladosporium Herbarum IgE: <0.1 kU/L
Cocklebur IgE: <0.1 kU/L
Cockroach, American IgE: 0.12 kU/L — AB
Common Silver Birch IgE: <0.1 kU/L
D Farinae IgE: 0.12 kU/L — AB
D Pteronyssinus IgE: 0.42 kU/L — AB
Dog Dander IgE: <0.1 kU/L
Elm, American IgE: <0.1 kU/L
Hickory, White IgE: 1.16 kU/L — AB
IgE (Immunoglobulin E), Serum: 74 IU/mL (ref 6–495)
Johnson Grass IgE: <0.1 kU/L
Kentucky Bluegrass IgE: <0.1 kU/L
Maple/Box Elder IgE: <0.1 kU/L
Mucor Racemosus IgE: <0.1 kU/L
Oak, White IgE: <0.1 kU/L
Penicillium Chrysogen IgE: <0.1 kU/L
Pigweed, Rough IgE: <0.1 kU/L
Plantain, English IgE: <0.1 kU/L
Ragweed, Short IgE: 0.15 kU/L — AB
Setomelanomma Rostrat: <0.1 kU/L
Timothy Grass IgE: <0.1 kU/L
White Mulberry IgE: <0.1 kU/L

## 2021-10-26 ENCOUNTER — Ambulatory Visit: Payer: BC Managed Care – PPO | Attending: Pulmonary Disease

## 2021-10-26 DIAGNOSIS — R053 Chronic cough: Secondary | ICD-10-CM | POA: Diagnosis not present

## 2021-10-26 LAB — SARS CORONAVIRUS 2 (TAT 6-24 HRS): SARS Coronavirus 2: NEGATIVE

## 2021-10-26 MED ORDER — ALBUTEROL SULFATE (2.5 MG/3ML) 0.083% IN NEBU
2.5000 mg | INHALATION_SOLUTION | Freq: Once | RESPIRATORY_TRACT | Status: AC
  Administered 2021-10-26: 2.5 mg via RESPIRATORY_TRACT
  Filled 2021-10-26: qty 3

## 2021-12-13 ENCOUNTER — Ambulatory Visit: Payer: BC Managed Care – PPO | Admitting: Pulmonary Disease

## 2021-12-13 ENCOUNTER — Encounter: Payer: Self-pay | Admitting: Pulmonary Disease

## 2021-12-13 VITALS — BP 120/84 | HR 88 | Temp 98.4°F | Ht 60.0 in | Wt 169.0 lb

## 2021-12-13 DIAGNOSIS — K219 Gastro-esophageal reflux disease without esophagitis: Secondary | ICD-10-CM | POA: Diagnosis not present

## 2021-12-13 DIAGNOSIS — J4540 Moderate persistent asthma, uncomplicated: Secondary | ICD-10-CM

## 2021-12-13 DIAGNOSIS — E65 Localized adiposity: Secondary | ICD-10-CM

## 2021-12-13 DIAGNOSIS — R053 Chronic cough: Secondary | ICD-10-CM | POA: Diagnosis not present

## 2021-12-13 DIAGNOSIS — J45909 Unspecified asthma, uncomplicated: Secondary | ICD-10-CM | POA: Insufficient documentation

## 2021-12-13 MED ORDER — ALBUTEROL SULFATE HFA 108 (90 BASE) MCG/ACT IN AERS: 2.0000 | INHALATION_SPRAY | Freq: Four times a day (QID) | RESPIRATORY_TRACT | 2 refills | Status: AC | PRN

## 2021-12-13 MED ORDER — FLUTICASONE FUROATE-VILANTEROL 100-25 MCG/ACT IN AEPB: 1.0000 | INHALATION_SPRAY | Freq: Every day | RESPIRATORY_TRACT | 11 refills | Status: DC

## 2021-12-13 NOTE — Patient Instructions (Signed)
We have sent prescriptions to your pharmacy of 2 inhalers 1 is Breo Ellipta this you will take every day the other 1 is albuterol which you use only as needed. ? ?Continue taking your Nexium (esomeprazole) for the reflux. ? ?Continue taking montelukast (Singulair). ? ? ? ? ? ? ? ? ? ? ? ? ? ? ?We will see you in follow-up in 2 to 3 months time call sooner should any new problems arise. ?

## 2021-12-13 NOTE — Progress Notes (Signed)
Subjective:    Patient ID: Veronica Padilla, female    DOB: 1971-05-12, 51 y.o.   MRN: [id number] Patient Care Team: Center, [facility] as PCP - General (General Practice)  Chief Complaint  Patient presents with   Follow-up    HPI Is a 51 year old Hispanic woman, lifelong never smoker, who presents for evaluation of episodes of cough, wheezing and chest tightness that recur every year.  She was initially evaluated on 19 October 2021 for the details of the visit please refer to that note. I am able to communicate with the patient in her native language. She states that she has been having this issue with recurrent bronchitis for the last 8 to 9 years worse after COVID 19 in July 2022. Episodes occur usually in the fall of the year. She does not usually have springtime exacerbations. She states that she had COVID-19 in January 2021 and July 2022. After her July episode she did develop issues with wheezing and was diagnosed with asthma. She is currently on as needed albuterol and Flovent 110 mcg 2 puffs twice a day. She notes that since addition of the Flovent she has done somewhat better. This was added in December 2022. She also had Singulair added to her regimen at that time and she feels that this is helpful. She also notes that Zyrtec helps with "allergies" which also helps her with her respiratory symptoms. She has had issues with gastroesophageal reflux that have been somewhat better since she had cholecystectomy on 23 December 2020.  At her initial visit she was placed on Nexium 40 mg daily and this has helped somewhat with her gastroesophageal reflux symptoms.  She had PFTs as noted below but however had great difficulty with the test due to language barrier.  At her prior visit she had CBC with differential that did not show any eosinophilia.  She had no evidence of significant allergen sensitivity with the exception of dust mites and hickory trees and ragweed.  These  were however low.  IgE was 74.  Today she presents having no major complaint.  Persistent symptoms despite the Flovent and albuterol.  DATA 10/26/2021 PFTs: Patient had difficulty with the test due to language barrier.  FEV1 2.21 L or 89% predicted, FVC 2.50 L or 81% predicted, FEV1/FVC 88%, there is bronchodilator response with regards to decrease in air trapping.  Lung volumes and flow volume loop are not reproducible.  Diffusion capacity normal.  Difficult to interpret test due to the patient's difficulty with performance.  Review of Systems A 10 point review of systems was performed and it is as noted above otherwise negative.  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   cetirizine (ZYRTEC) 10 MG tablet Take 10 mg by mouth daily.   esomeprazole (NEXIUM) 40 MG capsule Take  1 capsule (40 mg total) by mouth daily at 12 noon.   hydrochlorothiazide (MICROZIDE) 12.5 MG capsule Take 1 capsule (12.5 mg total) by mouth daily.   metFORMIN (GLUCOPHAGE-XR) 500 MG 24 hr tablet Take 500 mg by mouth daily.   montelukast (SINGULAIR) 10 MG tablet Take 10 mg by mouth daily.   [DISCONTINUED] Fluticasone Propionate, Inhal, (FLOVENT IN) Inhale into the lungs.    There is no immunization history on file for this patient.      Objective:   Physical Exam BP 120/84 (BP Location: Left  Arm, Patient Position: Sitting, Cuff Size: Normal)   Pulse 88   Temp 98.4 F (36.9 C) (Oral)   Ht 5' (1.524 m)   Wt 169 lb (76.7 kg)   SpO2 98%   BMI 33.01 kg/m  GENERAL: Obese woman, no acute distress, fully ambulatory, no conversational dyspnea. HEAD: Normocephalic, atraumatic.  EYES: Pupils equal, round, reactive to light.  No scleral icterus.  MOUTH: Nose/mouth/throat not examined due to masking requirements for COVID 19. NECK: Supple. No thyromegaly. Trachea midline. No JVD.  No adenopathy. PULMONARY: Good air entry bilaterally.  No adventitious sounds. CARDIOVASCULAR: S1 and S2. Regular rate and rhythm.  No rubs, murmurs or gallops heard. ABDOMEN: Obese otherwise benign MUSCULOSKELETAL: No joint deformity, no clubbing, no edema.  NEUROLOGIC: No focal deficit, no gait disturbance, speech is fluent. SKIN: Intact,warm,dry. PSYCH: Mood and behavior normal.  Recent Results (from the past 2160 hour(s))  CBC with Differential/Platelet     Status: Abnormal   Collection Time: 10/19/21 10:29 AM  Result Value Ref Range   WBC 13.0 (H) 4.0 - 10.5 K/uL   RBC 4.70 3.87 - 5.11 MIL/uL   Hemoglobin 14.6 12.0 - 15.0 g/dL   HCT 41.7 36.0 - 46.0 %   MCV 88.7 80.0 - 100.0 fL   MCH 31.1 26.0 - 34.0 pg   MCHC 35.0 30.0 - 36.0 g/dL   RDW 12.5 11.5 - 15.5 %   Platelets 238 150 - 400 K/uL   nRBC 0.0 0.0 - 0.2 %   Neutrophils Relative % 59 %   Neutro Abs 7.8 (H) 1.7 - 7.7 K/uL   Lymphocytes Relative 29 %   Lymphs Abs 3.8 0.7 - 4.0 K/uL   Monocytes Relative 9 %   Monocytes Absolute 1.1 (H) 0.1 - 1.0 K/uL   Eosinophils Relative 1 %   Eosinophils Absolute 0.1 0.0 - 0.5 K/uL   Basophils Relative 1 %   Basophils Absolute 0.1 0.0 - 0.1 K/uL   Immature Granulocytes 1 %   Abs Immature Granulocytes 0.14 (H) 0.00 - 0.07 K/uL    Comment: Performed at [facility], [address] [place], [place] [address]  Allergen Panel (27) + IGE     Status: Abnormal   Collection Time: 10/19/21 10:29 AM   Result Value Ref Range   Class Description Allergens Comment     Comment: (NOTE)    Levels of Specific IgE       Class  Description of Class    ---------------------------  -----  --------------------                   < 0.10         0         Negative           0.10 -    0.31         0/I       Equivocal/Low  0.32 -    0.55         I         Low           0.56 -    1.40         II        Moderate           1.41 -    3.90         III       High           3.91 -   19.00         IV        Very High          19.01 -  100.00         V         Very High                  >100.00         VI        Very High    IgE (Immunoglobulin E), Serum 74 6 - 495 IU/mL   D Pteronyssinus IgE 0.42 (A) Class I kU/L   D Farinae IgE 0.12 (A) Class 0/I kU/L   Cat Dander IgE <0.10 Class 0 kU/L   Dog Dander IgE <0.10 Class 0 kU/L   [place] Grass IgE <0.10 Class 0 kU/L   Timothy Grass IgE <0.10 Class 0 kU/L   [place] Bluegrass IgE <0.10 Class 0 kU/L   Johnson Grass IgE <0.10 Class 0 kU/L   Bahia Grass IgE <0.10 Class 0 kU/L   Cockroach, American IgE 0.12 (A) Class 0/I kU/L    Comment: (NOTE) This test was developed and its performance characteristics determined by [facility].  It has not been cleared or approved by the U.S. Food and Drug Administration. The FDA has determined that such clearance or approval is not necessary. This test is used for clinical purposes.  It should not be regarded as investigational or for research.    Penicillium Chrysogen IgE <0.10 Class 0 kU/L   Cladosporium Herbarum IgE <0.10 Class 0 kU/L   Aspergillus Fumigatus IgE <0.10 Class 0 kU/L   Mucor Racemosus IgE <0.10 Class 0 kU/L   Alternaria Alternata IgE <0.10 Class 0 kU/L   Setomelanomma Rostrat <0.10 Class 0 kU/L   Oak, White IgE <0.10 Class 0 kU/L   Elm, American IgE <0.10 Class 0 kU/L   Maple/Box Elder IgE <0.10 Class 0 kU/L   Common Silver Wendee Copp IgE <0.10 Class 0 kU/L   Hickory, White IgE 1.16 (A) Class II kU/L     Comment: (NOTE) This test was developed and its performance characteristics determined by [facility].  It has not been cleared or approved by the U.S. Food and Drug Administration. The FDA has determined that such clearance or approval is not necessary. This test is used for clinical purposes.  It should not be regarded as investigational or for research.    White Mulberry IgE <0.10 Class 0 kU/L   Cedar, [place] IgE <0.10 Class 0 kU/L   Ragweed, Short IgE 0.15 (A) Class 0/I kU/L   Plantain, English IgE <0.10 Class 0 kU/L   Cocklebur IgE <0.10 Class 0 kU/L   Pigweed, Rough IgE <0.10 Class 0 kU/L    Comment: (NOTE) Performed At: [facility] [address] [place], [place] [id number] Rush Farmer MD [id number]   SARS CORONAVIRUS 2 (TAT  6-24 HRS) Nasopharyngeal Nasopharyngeal Swab     Status: None   Collection Time: 10/25/21  8:07 AM   Specimen: Nasopharyngeal Swab  Result Value Ref Range   SARS Coronavirus 2 NEGATIVE NEGATIVE    Comment: (NOTE) SARS-CoV-2 target nucleic acids are NOT DETECTED.  The SARS-CoV-2 RNA is generally detectable in upper and lower respiratory specimens during the acute phase of infection. Negative results do not preclude SARS-CoV-2 infection, do not rule out co-infections with other pathogens, and should not be used as the sole basis for treatment or other patient management decisions. Negative results must be combined with clinical observations, patient history, and epidemiological information. The expected result is Negative.  Fact Sheet for Patients: SugarRoll.be  Fact Sheet for Healthcare Providers: https://www.woods-mathews.com/  This test is not yet approved or cleared by the [place] FDA and  has been authorized for detection and/or diagnosis of SARS-CoV-2 by FDA under an Emergency Use Authorization (EUA). This EUA will remain  in effect (meaning this test can be used) for the  duration of the COVID-19 declaration under Se ction 564(b)(1) of the Act, 21 U.S.C. section 360bbb-3(b)(1), unless the authorization is terminated or revoked sooner.  Performed at [facility], [place] [address], [place], [place] [address]          Assessment & Plan:     ICD-10-CM   1. 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 Chronic GERD  K21.9    Continue Nexium 40 mg daily Antireflux measures    3. Chronic cough  R05.3    Triggered by GERD Poorly compensated asthmatic bronchitis also issue    4. Truncal obesity  E65    Would benefit from weight loss LONA (Late Onset Nonallergic Asthma) Obesity adds to airway inflammation     Meds ordered this encounter  Medications   Fluticasone furoate-vilanterol (BREO ELLIPTA) 100-25 MCG/ACT AEPB    Sig: Inhale 1 puff into the lungs daily.    Dispense:  26 each    Refill:  11    Provide instructions in Spanish   albuterol (VENTOLIN HFA) 108 (90 Base) MCG/ACT inhaler    Sig: Inhale 2 puffs into the lungs every 6 (six) hours as needed for wheezing or shortness of breath.    Dispense:  8 g    Refill:  2    Provide Instructions in Spanish   C. Derrill Kay, MD Advanced Bronchoscopy PCCM [facility]-    *This note was dictated using voice recognition software/Dragon.  Despite best efforts to proofread, errors can occur which can change the meaning. Any transcriptional errors that result from this process are unintentional and may not be fully corrected at the time of dictation.

## 2022-02-21 ENCOUNTER — Encounter: Payer: Self-pay | Admitting: Pulmonary Disease

## 2022-02-21 ENCOUNTER — Ambulatory Visit (INDEPENDENT_AMBULATORY_CARE_PROVIDER_SITE_OTHER): Payer: BC Managed Care – PPO | Admitting: Pulmonary Disease

## 2022-02-21 VITALS — BP 128/74 | HR 99 | Temp 97.6°F | Ht 60.0 in | Wt 168.0 lb

## 2022-02-21 DIAGNOSIS — R197 Diarrhea, unspecified: Secondary | ICD-10-CM | POA: Diagnosis not present

## 2022-02-21 DIAGNOSIS — K219 Gastro-esophageal reflux disease without esophagitis: Secondary | ICD-10-CM | POA: Diagnosis not present

## 2022-02-21 DIAGNOSIS — J4540 Moderate persistent asthma, uncomplicated: Secondary | ICD-10-CM

## 2022-02-21 DIAGNOSIS — R053 Chronic cough: Secondary | ICD-10-CM | POA: Diagnosis not present

## 2022-02-21 NOTE — Progress Notes (Signed)
Subjective:    Patient ID: Veronica Padilla, female    DOB: March 14, 1971, 51 y.o.   MRN: [id number] Patient Care Team: Center, [facility] as PCP - General (General Practice)  Chief Complaint  Patient presents with   Follow-up    No current sx.     HPI This is a 51 year old Hispanic woman, lifelong never smoker, who presents for follow-up on issues with cough, wheezing and chest tightness that recur every year.  I am able to communicate with the patient in her native language, Spanish.  She has noted that previously she had significant seasonal variation with her symptoms particularly in the fall of the year.  Spring of the year is usually not an issue.  However after an 2 episodes of COVID-19 in January 2021 and July 2022 she has noted that the symptoms have been pretty much perennial.  She was diagnosed with asthma after those episodes.  She has significant gastroesophageal reflux and has been on PPI which has helped some with her symptoms however this persists.  Previously she had a cholecystectomy in April 2022 and initially this improves her reflux symptoms but these have now recurred.  She will need to have referral to GI due to her persistent symptoms.  At her prior visit she was started on [facility] and this has helped some with her recurrent issues with cough, wheezing and shortness of breath.  She does not endorse any new symptomatology today.  Gastroesophageal reflux is still a major problem for her.   DATA 10/26/2021 PFTs: Patient had difficulty with the test due to language barrier.  FEV1 2.21 L or 89% predicted, FVC 2.50 L or 81% predicted, FEV1/FVC 88%, there is bronchodilator response with regards to decrease in air trapping.  Lung volumes and flow volume loop are not reproducible.  Diffusion capacity normal.  Difficult to interpret test due to the patient's difficulty with performance.  Review of Systems A 10 point review of systems was performed and  it is as noted above otherwise negative.  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   cetirizine (ZYRTEC) 10 MG tablet Take 10 mg by mouth daily.   esomeprazole (NEXIUM) 40 MG capsule Take 1 capsule (40 mg total) by mouth daily at 12 noon.   fluticasone furoate-vilanterol (BREO ELLIPTA) 100-25 MCG/ACT AEPB Inhale 1 puff into the lungs daily.   hydrochlorothiazide (MICROZIDE) 12.5 MG capsule Take 1 capsule (12.5 mg total) by mouth daily.   metFORMIN (GLUCOPHAGE-XR) 500 MG 24 hr tablet Take 500 mg by mouth daily.   montelukast (SINGULAIR) 10 MG tablet Take 10 mg by mouth daily.    There is no immunization history on file for this patient.     Objective:   Physical Exam BP 128/74 (BP Location: Left Arm, Cuff Size: Large)  Pulse 99   Temp 97.6 F (36.4 C) (Temporal)   Ht 5' (1.524 m)   Wt 168 lb (76.2 kg)   SpO2 98%   BMI 32.81 kg/m  GENERAL: Obese woman, no acute distress, fully ambulatory, no conversational dyspnea. HEAD: Normocephalic, atraumatic.  EYES: Pupils equal, round, reactive to light.  No scleral icterus.  MOUTH: Nose/mouth/throat not examined due to masking requirements for COVID 19. NECK: Supple. No thyromegaly. Trachea midline. No JVD.  No adenopathy. PULMONARY: Good air entry bilaterally.  No adventitious sounds. CARDIOVASCULAR: S1 and S2. Regular  rate and rhythm.  No rubs, murmurs or gallops heard. ABDOMEN: Obese otherwise benign MUSCULOSKELETAL: No joint deformity, no clubbing, no edema.  NEUROLOGIC: No focal deficit, no gait disturbance, speech is fluent. SKIN: Intact,warm,dry. PSYCH: Mood and behavior normal.     Assessment & Plan:     ICD-10-CM   1. 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 Chronic GERD  K21.9 Ambulatory referral to Gastroenterology   On Nexium improved Still continues to have some issues Referral to GI for evaluation    3. Chronic cough - Controlled  R05.3    Better control with GERD management     4. Diarrhea, unspecified type  R19.7    Describes rapid transit Chronic issue Referred to GI for evaluation     Orders Placed This Encounter  Procedures   Ambulatory referral to Gastroenterology    Referral Priority:   Routine    Referral Type:   Consultation    Referral Reason:   Specialty Services Required    Referred to Provider:   Toney Reil, MD    Number of Visits Requested:   1   Patient will be referred to gastroenterology.  She is to continue her medications as they currently are.  We will see her in follow-up in 4 months time she is to contact us prior to that time should any new difficulties arise.  Gailen Shelter, MD Advanced Bronchoscopy PCCM [facility]    *This note was dictated using voice recognition software/Dragon.  Despite best efforts to proofread, errors can occur which can change the meaning. Any transcriptional errors that result from this process are unintentional and may not be fully corrected at the time of dictation.

## 2022-02-21 NOTE — Patient Instructions (Signed)
We are sending a referral to the gastroenterologist.  Continue your medications as they currently are.  We will see you in follow-up in 4 months time.

## 2022-02-28 ENCOUNTER — Other Ambulatory Visit: Payer: Self-pay | Admitting: Family Medicine

## 2022-02-28 DIAGNOSIS — Z1231 Encounter for screening mammogram for malignant neoplasm of breast: Secondary | ICD-10-CM

## 2022-04-01 ENCOUNTER — Other Ambulatory Visit: Payer: Self-pay

## 2022-04-01 MED ORDER — ESOMEPRAZOLE MAGNESIUM 40 MG PO CPDR: 40.0000 mg | DELAYED_RELEASE_CAPSULE | Freq: Every day | ORAL | 3 refills | Status: DC

## 2022-04-29 ENCOUNTER — Ambulatory Visit
Admission: RE | Admit: 2022-04-29 | Discharge: 2022-04-29 | Disposition: A | Discharge status: Home / Self Care | Payer: BC Managed Care – PPO | Source: Ambulatory Visit | Attending: Family Medicine | Admitting: Family Medicine

## 2022-04-29 ENCOUNTER — Other Ambulatory Visit: Payer: Self-pay | Admitting: Family Medicine

## 2022-04-29 DIAGNOSIS — R059 Cough, unspecified: Secondary | ICD-10-CM | POA: Diagnosis present

## 2022-04-30 ENCOUNTER — Ambulatory Visit: Payer: BC Managed Care – PPO | Admitting: Pulmonary Disease

## 2022-05-06 ENCOUNTER — Telehealth: Payer: Self-pay | Admitting: Pulmonary Disease

## 2022-05-06 NOTE — Telephone Encounter (Signed)
Lm x1 for patient.  

## 2022-05-06 NOTE — Telephone Encounter (Signed)
Lm x2 for patient.   

## 2022-05-07 MED ORDER — METHYLPREDNISOLONE 4 MG PO TBPK: ORAL_TABLET | ORAL | 0 refills | Status: DC

## 2022-05-07 MED ORDER — AZITHROMYCIN 250 MG PO TABS: ORAL_TABLET | ORAL | 0 refills | Status: AC

## 2022-05-07 NOTE — Telephone Encounter (Signed)
Spoke to patient via interpreter. She reports of SOB, dry throat, chest tightness, wheezing and non prod cough x2w.  Using Breo once daily and ventolin HFA Q4H.  Dr. Jayme Cloud, please advise. Thanks

## 2022-05-07 NOTE — Telephone Encounter (Signed)
Spoke to patient, she actually developed symptoms Thursday went to see her primary who took chest x-ray was clear, COVID test was negative.  Mostly issues with cough initially dry now with some yellowish sputum production.  Had a very short course of prednisone now completed and albuterol without relief.  No fevers or chills.  Recommend longer course of steroids with Medrol Dosepak and also recommend Azithromycin Z-Pak.  Please send this to [facility].

## 2022-05-07 NOTE — Addendum Note (Signed)
Addended by: Lajoyce Lauber A on: 05/07/2022 09:50 AM   Modules accepted: Orders

## 2022-05-07 NOTE — Telephone Encounter (Signed)
Zpak and medrol has been sent to preferred pharmacy. Nothing further needed.

## 2022-05-09 ENCOUNTER — Encounter: Payer: Self-pay | Admitting: Pulmonary Disease

## 2022-05-09 ENCOUNTER — Ambulatory Visit: Payer: BC Managed Care – PPO | Admitting: Pulmonary Disease

## 2022-05-09 VITALS — BP 140/80 | HR 98 | Temp 98.0°F | Ht 60.0 in | Wt 168.4 lb

## 2022-05-09 DIAGNOSIS — J4541 Moderate persistent asthma with (acute) exacerbation: Secondary | ICD-10-CM | POA: Diagnosis not present

## 2022-05-09 DIAGNOSIS — K219 Gastro-esophageal reflux disease without esophagitis: Secondary | ICD-10-CM

## 2022-05-09 MED ORDER — TRELEGY ELLIPTA 200-62.5-25 MCG/ACT IN AEPB
1.0000 | INHALATION_SPRAY | Freq: Every day | RESPIRATORY_TRACT | 0 refills | Status: DC
Start: 2022-05-09 — End: 2022-06-13

## 2022-05-09 NOTE — Progress Notes (Signed)
Received 3 Pfizer vaccines.

## 2022-05-09 NOTE — Progress Notes (Signed)
Subjective:    Patient ID: Veronica Padilla, female    DOB: 10-25-1970, 51 y.o.   MRN: [id number] Patient Care Team: Center, [facility] as PCP - General (General Practice)  Chief Complaint  Patient presents with   Follow-up   HPI Veronica Padilla is a 51 year old Hispanic woman, lifelong never smoker who presents as an acute visit after developing cough with yellowish sputum production symptom onset around 14 August.  She was evaluated by her primary care physician with a chest x-ray which was clear, COVID-19 testing was negative.  Given only a few tablets of prednisone with initial improvement.  Then on the 21st she noted worsening of symptoms and recurrent cough of yellowish sputum.  She is now in the midst of a Medrol and Azithromycin Dosepaks.  She feels that these have helped her.  She continues to have issues with severe gastroesophageal reflux which has been postulated as the cause of her exacerbations.  She is on Nexium but continues to have breakthrough symptoms.  She has not yet had her GI evaluation which was sent at her last visit.   She has not had any fevers, chills or sweats.  As noted she notes improvement in her symptoms since starting the Medrol and the azithromycin.  She we provided the patient with instructions of the above.  Is compliant with Breo Ellipta and as needed albuterol.   Review of Systems A 10 point review of systems was performed and it is as noted above otherwise negative.  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   azithromycin (ZITHROMAX) 250 MG tablet Take 2 tablets (500 mg) on  Day 1,  followed by 1 tablet (250 mg) once daily on Days 2 through 5.   cetirizine (ZYRTEC) 10 MG tablet Take 10 mg by mouth daily.   esomeprazole (NEXIUM) 40 MG capsule Take 1 capsule (40 mg total) by mouth daily at 12 noon.   fluticasone furoate-vilanterol (BREO ELLIPTA) 100-25 MCG/ACT AEPB Inhale 1 puff into the lungs daily.   hydrochlorothiazide (MICROZIDE) 12.5 MG capsule Take 1 capsule (12.5 mg total) by mouth daily.   metFORMIN (GLUCOPHAGE-XR) 500 MG 24 hr tablet Take 500 mg by mouth daily.   methylPREDNISolone (MEDROL DOSEPAK) 4 MG TBPK tablet Use as directed   montelukast (SINGULAIR) 10 MG tablet Take 10 mg by mouth daily.   Immunization History  Administered Date(s) Administered   Influenza,inj,Quad PF,6+ Mos 07/16/2021      Objective:   Physical Exam BP (!) 140/80 (BP Location: Left Arm, Patient Position: Sitting, Cuff Size: Normal)   Pulse 98   Temp 98 F (36.7 C) (Oral)   Wt 168 lb 6.4 oz (76.4 kg)   SpO2 95%   BMI 32.89 kg/m  GENERAL: Obese woman, no acute distress, fully ambulatory, no conversational dyspnea.  HEAD: Normocephalic, atraumatic.  EYES: Pupils equal, round, reactive to light.  No scleral icterus.  MOUTH: Nose/mouth/throat not examined due to masking requirements for COVID 19. NECK: Supple. No thyromegaly. Trachea midline. No JVD.  No adenopathy. PULMONARY: Good air entry bilaterally.  No adventitious sounds. CARDIOVASCULAR: S1 and S2. Regular rate and rhythm.  No rubs, murmurs or gallops heard. ABDOMEN: Obese otherwise benign MUSCULOSKELETAL: No joint deformity, no clubbing, no edema.  NEUROLOGIC: No focal deficit, no gait disturbance, speech is fluent. SKIN: Intact,warm,dry. PSYCH: Mood  and behavior normal.  Chest x-ray performed 06 May 2022 showing no active disease:      Assessment & Plan:     ICD-10-CM   1. Moderate persistent asthmatic bronchitis with acute exacerbation  J45.41    We will switch her from Breo to Trelegy Ellipta 200, 1 inhalation daily Continue Medrol and Azithromycin prescribed for yesterday Follow-up as scheduled     2. Chronic GERD  K21.9    Is currently on Nexium Having breakthrough symptoms GI consult pending     Meds ordered this encounter  Medications   Fluticasone-Umeclidin-Vilant (TRELEGY ELLIPTA) 200-62.5-25 MCG/ACT AEPB    Sig: Inhale 1 puff into the lungs daily.    Dispense:  2 each    Refill:  0    Order Specific Question:   Lot Number?    Answer:   [id number]    Order Specific Question:   Expiration Date?    Answer:   11/14/2023    Order Specific Question:   Manufacturer?    Answer:   [employer] [12]    Order Specific Question:   Quantity    Answer:   2    We provided the patient with instructions of the interventions as noted above.  Will make sure that GI evaluation is scheduled.  I suspect her episodes of gastroesophageal reflux are leading to chronic silent aspiration and therefore, her exacerbations.  We will see her in follow-up in September as previously scheduled.  She is to contact us prior to that time should any new difficulties arise.  Veronica Shelter, MD Advanced Bronchoscopy PCCM [facility]    *This note was dictated using voice recognition software/Dragon.  Despite best efforts to proofread, errors can occur which can change the meaning. Any transcriptional errors that result from this process are unintentional and may not be fully corrected at the time of dictation.   Marland Kitchen

## 2022-05-09 NOTE — Patient Instructions (Signed)
We are going to stop the [employer] (inhaler with the light blue top).  You are being given a trial of Trelegy Ellipta this is 1 puff daily, make sure you rinse your mouth well after you use it.  Let us know how you are doing with the Trelegy inhaler so we can call the prescription for your pharmacy.  Complete the Medrol and the Azithromycin descriptions that were given to you.  We will resubmit the referral to gastroenterology.  You have an appointment coming up in September, keep that appointment.   Por favor deja de Teacher, music (inhalador con tapa azul claro).  Te hemos dado unas muestras de Trelegy Ellipta: 1 inhalacin al da; asegrese de enjuagarse bien la boca despus de usarlo.  Hganos saber cmo le va con el inhalador Trelegy para que podamos llamar a la receta de su farmacia.  Complete las descripciones de Medrol y Azitromicina que se le dieron.  Volveremos a enviarel referido a Cytogeneticist.  Tienes una cita de seguimiento en septiembre, acude a esa cita.

## 2022-06-13 ENCOUNTER — Ambulatory Visit: Payer: BC Managed Care – PPO | Admitting: Pulmonary Disease

## 2022-06-13 ENCOUNTER — Encounter: Payer: Self-pay | Admitting: Pulmonary Disease

## 2022-06-13 VITALS — BP 134/76 | HR 106 | Temp 97.5°F | Ht 60.0 in | Wt 170.2 lb

## 2022-06-13 DIAGNOSIS — R053 Chronic cough: Secondary | ICD-10-CM

## 2022-06-13 DIAGNOSIS — J4540 Moderate persistent asthma, uncomplicated: Secondary | ICD-10-CM

## 2022-06-13 DIAGNOSIS — K219 Gastro-esophageal reflux disease without esophagitis: Secondary | ICD-10-CM | POA: Diagnosis not present

## 2022-06-13 DIAGNOSIS — J4541 Moderate persistent asthma with (acute) exacerbation: Secondary | ICD-10-CM | POA: Diagnosis not present

## 2022-06-13 LAB — NITRIC OXIDE: Nitric Oxide: 8

## 2022-06-13 MED ORDER — TRELEGY ELLIPTA 100-62.5-25 MCG/ACT IN AEPB: 1.0000 | INHALATION_SPRAY | Freq: Every day | RESPIRATORY_TRACT | 0 refills | Status: AC

## 2022-06-13 MED ORDER — TRELEGY ELLIPTA 100-62.5-25 MCG/ACT IN AEPB: 1.0000 | INHALATION_SPRAY | Freq: Every day | RESPIRATORY_TRACT | 11 refills | Status: AC

## 2022-06-13 MED ORDER — ESOMEPRAZOLE MAGNESIUM 40 MG PO CPDR: 40.0000 mg | DELAYED_RELEASE_CAPSULE | Freq: Every day | ORAL | 3 refills | Status: AC

## 2022-06-13 MED ORDER — BREZTRI AEROSPHERE 160-9-4.8 MCG/ACT IN AERO: 2.0000 | INHALATION_SPRAY | Freq: Two times a day (BID) | RESPIRATORY_TRACT | 0 refills | Status: AC

## 2022-06-13 NOTE — Progress Notes (Signed)
Subjective:    Patient ID: Veronica Padilla, female    DOB: Dec 01, 1970, 51 y.o.   MRN: [id number] Patient Care Team: [facility] as PCP - General (General Practice)  Chief Complaint  Patient presents with   Follow-up    HPI [name] is a 51 year old Hispanic woman, lifelong never smoker who presents for follow-up.  She was last seen here on 09 May 2022, she has moderate persistent asthma/asthmatic bronchitis and severe gastroesophageal reflux symptoms.  She ran out of her Trelegy and Nexium but did not call us for refills.  She ran out 2 days ago. She continues to have issues with severe gastroesophageal reflux and globus sensation secondary to the same.  She was referred to gastroenterology however they have attempted to reach her and she has not answer the phone.  They also sent her a letter.  This was communicated to the patient and we provided her with [facility]'s number so she can make an appointment.  She has not had any fevers, chills or sweats.  She feels Trelegy helped her symptoms better than the Breo.  She does not endorse any other symptomatology.  Her last visit she was getting over an exacerbation.  She has not had an exacerbation since that time  Review of Systems A 10 point review of systems was performed and it is as noted above otherwise negative.  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   cetirizine (ZYRTEC) 10 MG tablet Take 10 mg by mouth daily.   esomeprazole (NEXIUM) 40 MG capsule Take 1 capsule (40 mg total) by mouth daily at 12 noon.   Fluticasone-Umeclidin-Vilant (TRELEGY ELLIPTA) 100-62.5-25 MCG/ACT AEPB Inhale 1 puff into the lungs daily.   hydrochlorothiazide (MICROZIDE) 12.5 MG capsule Take 1 capsule (12.5 mg total) by mouth daily.   metFORMIN (GLUCOPHAGE-XR) 500 MG 24 hr tablet Take 500 mg by mouth daily.   montelukast (SINGULAIR) 10 MG tablet Take 10 mg by mouth daily.   [DISCONTINUED] esomeprazole (NEXIUM) 40 MG capsule Take 1 capsule (40 mg total) by mouth daily at 12 noon.   [DISCONTINUED] Fluticasone-Umeclidin-Vilant (TRELEGY ELLIPTA) 200-62.5-25 MCG/ACT AEPB Inhale 1 puff into the lungs daily.   Immunization History  Administered Date(s) Administered   Influenza,inj,Quad PF,6+ Mos 07/16/2021      Objective:   Physical Exam BP 134/76 (BP Location: Left Arm, Cuff Size: Normal)   Pulse (!) 106   Temp (!) 97.5 F (36.4 C) (Oral)   Ht 5' (1.524 m)   Wt 170 lb 3.2 oz (77.2 kg)   SpO2 98% Comment: on RA  BMI 33.24 kg/m  GENERAL: Obese woman, no acute distress, fully ambulatory, no conversational dyspnea. HEAD: Normocephalic, atraumatic.  EYES: Pupils equal, round, reactive to light.  No scleral icterus.  MOUTH: Nose/mouth/throat not examined due to  masking requirements for COVID 19. NECK: Supple. No thyromegaly. Trachea midline. No JVD.  No adenopathy. PULMONARY: Good air entry bilaterally.  No adventitious sounds. CARDIOVASCULAR: S1 and S2. Regular rate and rhythm.  No rubs, murmurs or gallops heard. ABDOMEN: Obese otherwise benign MUSCULOSKELETAL: No joint deformity, no clubbing, no edema.  NEUROLOGIC: No focal deficit, no gait disturbance, speech is fluent. SKIN: Intact,warm,dry. PSYCH: Mood and behavior normal.   Lab Results  Component Value Date    NITRICOXIDE 8 06/13/2022  Results consistent with good asthma control    Assessment & Plan:     ICD-10-CM   1. Moderate persistent asthma without complication  123456 Nitric oxide   Will decrease Trelegy to 100 strength Nitric oxide consistent with good asthma control     2. Chronic GERD  K21.9    On Nexium 40 mg daily Better but still with a lot of breakthrough symptoms Still with severe reflux Has been referred to GI    3. Chronic cough  R05.3    Has recurred over the last few days Ran out of Trelegy, did not call Ran out of Nexium, did not call     Meds ordered this encounter  Medications   Fluticasone-Umeclidin-Vilant (TRELEGY ELLIPTA) 100-62.5-25 MCG/ACT AEPB    Sig: Inhale 1 puff into the lungs daily.    Dispense:  28 each    Refill:  11   esomeprazole (NEXIUM) 40 MG capsule    Sig: Take 1 capsule (40 mg total) by mouth daily at 12 noon.    Dispense:  30 capsule    Refill:  3   We will see the patient in follow-up in 3 months time.  She was given the phone number to [facility] gastroenterology so she can call and make an appointment.  Renold Don, MD Advanced Bronchoscopy [facility]    *This note was dictated using voice recognition software/Dragon.  Despite best efforts to proofread, errors can occur which can change the meaning. Any transcriptional errors that result from this process are unintentional and may not be fully corrected at the time of dictation.

## 2022-06-13 NOTE — Patient Instructions (Addendum)
You have a referral to [facility] in [place].  Phone number is [phone].  I have tried reaching you but state that you have not answered the phone they also sent you a letter.  Since you have the referral you can make a phone call and call them.  Your breathing test that you had today shows that your asthma is well controlled.  The cough is likely due to your reflux problems.  We have refilled your medications and they have been sent to the [facility].  We will see you back in 4 months time call sooner should any new problems arise.  Tiene un referido a Investment banker, corporate. El nmero de telfono es [phone]. Dicen que intentaron comunicarse con usted pero dicen que no contest el telfono . Ellos dicen tambin que le enviaron una carta. Como tiene el referido, puede hacer una llamada telefnica hacer cita con ellos.  La prueba de respiracin que le hicieron hoy muestra que su asma est bien controlada. Es probable que la tos se deba a sus problemas de reflujo esofagal.  Hemos enviado receta para sus medicamentos a [place].  Nos veremos dentro de 4 meses. Llmenos antes si surge algn problema nuevo.

## 2022-06-14 ENCOUNTER — Encounter: Payer: Self-pay | Admitting: Pulmonary Disease

## 2022-06-14 DIAGNOSIS — K219 Gastro-esophageal reflux disease without esophagitis: Secondary | ICD-10-CM

## 2022-06-14 NOTE — Telephone Encounter (Signed)
Anita, please advise.  

## 2022-06-18 NOTE — Telephone Encounter (Signed)
Margie place new GI referral and I faxed the referral to [facility] 06/17/22

## 2022-10-17 IMAGING — US US ABDOMEN LIMITED RUQ/ASCITES
2 series · 14 of 25 positions shown · non-contrast
Comparison: Abdominal ultrasound 07/07/2012

CLINICAL DATA: Hepatitis.

EXAM:
ULTRASOUND ABDOMEN LIMITED RIGHT UPPER QUADRANT

[Series 1: us abdomen limited ruq (liver/gb) · 13 of 56 slices shown]
[im 1/56]
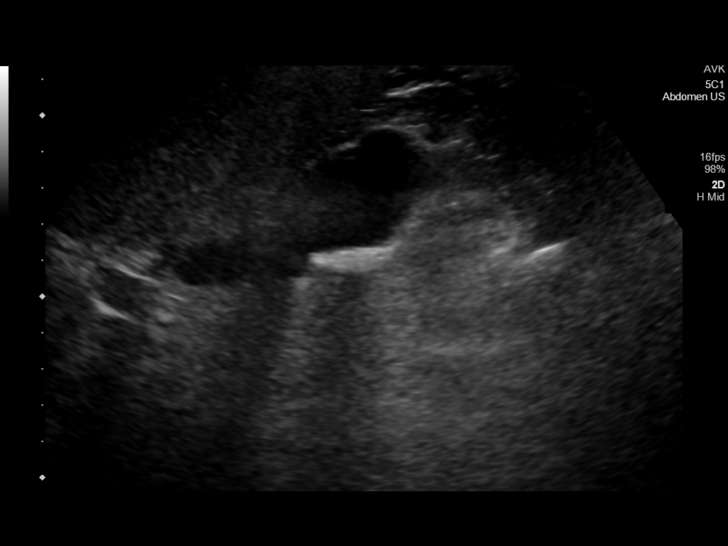
[im 5/56]
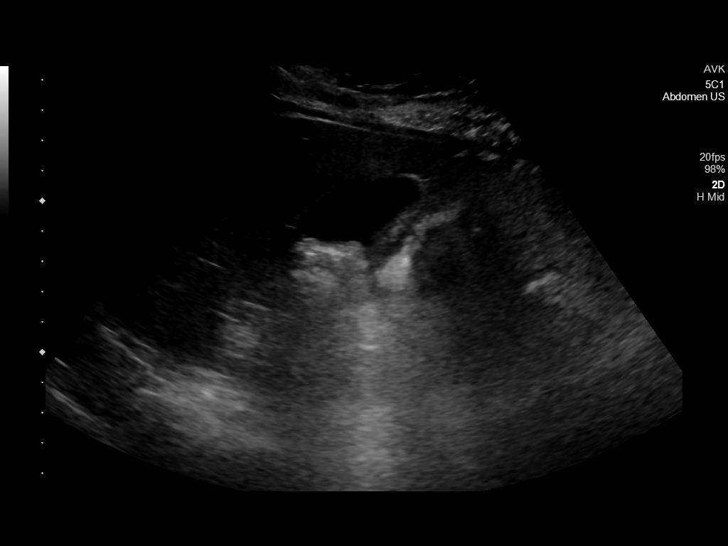
[im 10/56]
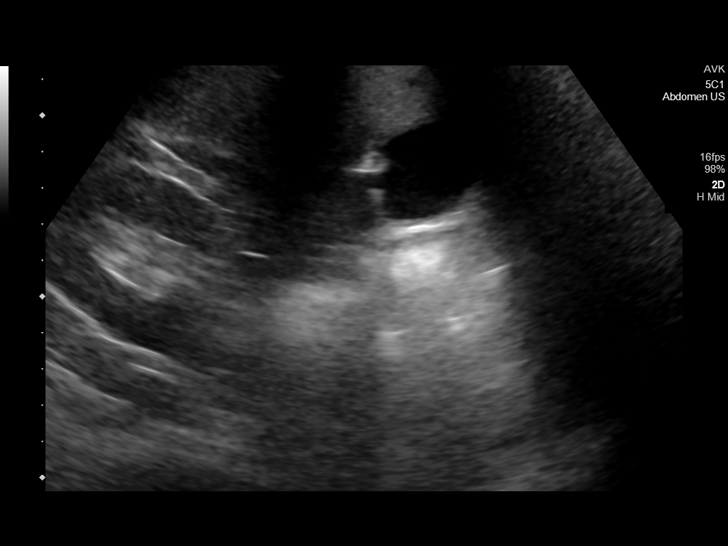
[im 15/56]
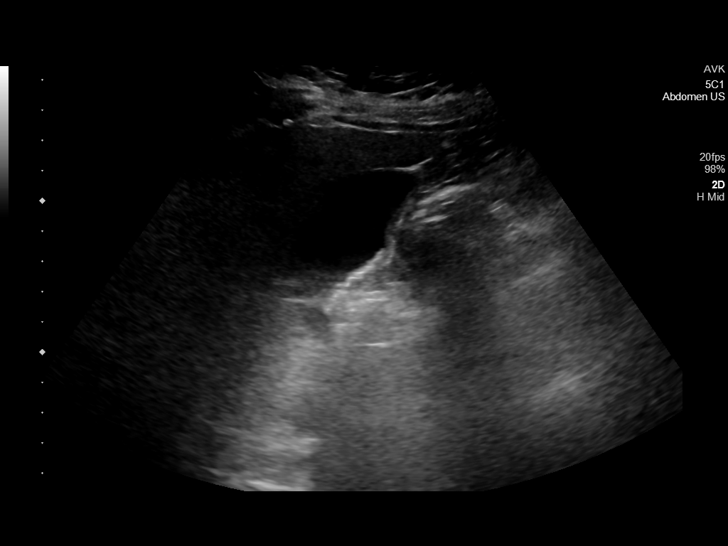
[im 20/56]
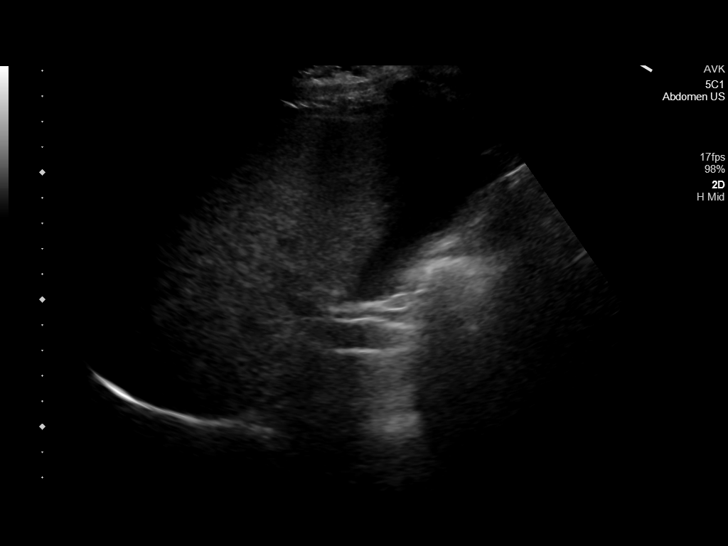
[im 22/56]
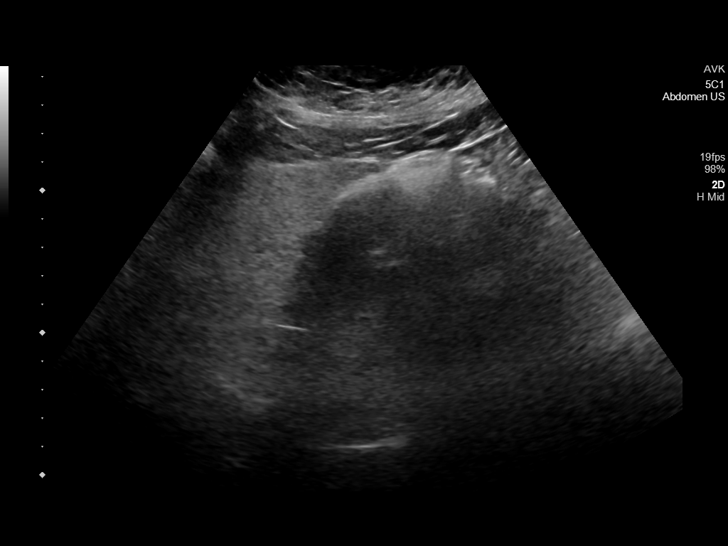
[im 27/56]
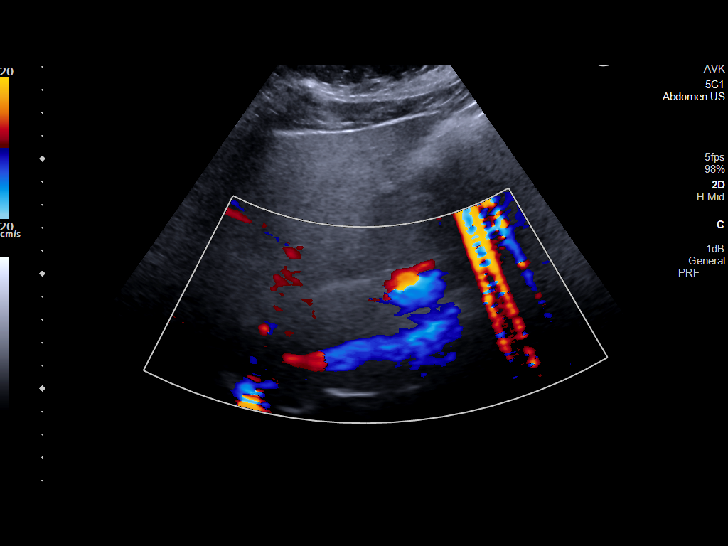
[im 32/56]
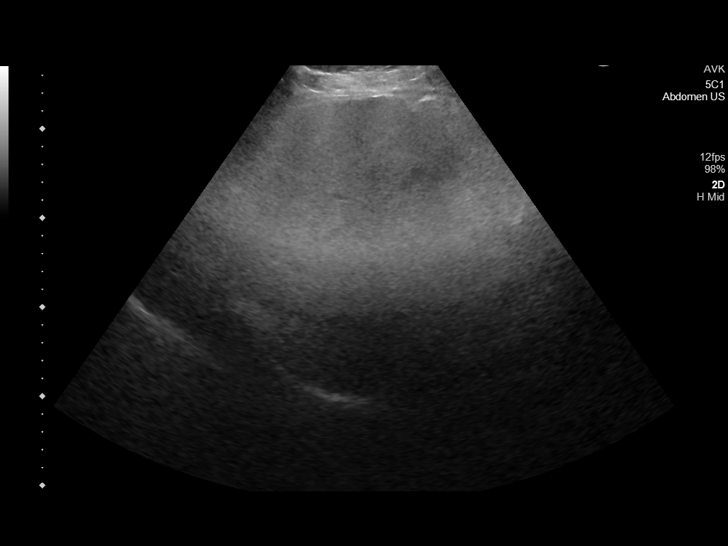
[im 36/56]
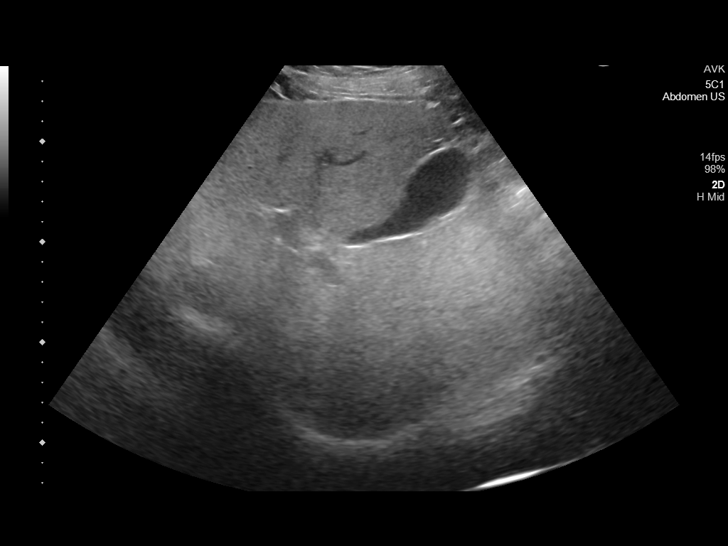
[im 39/56]
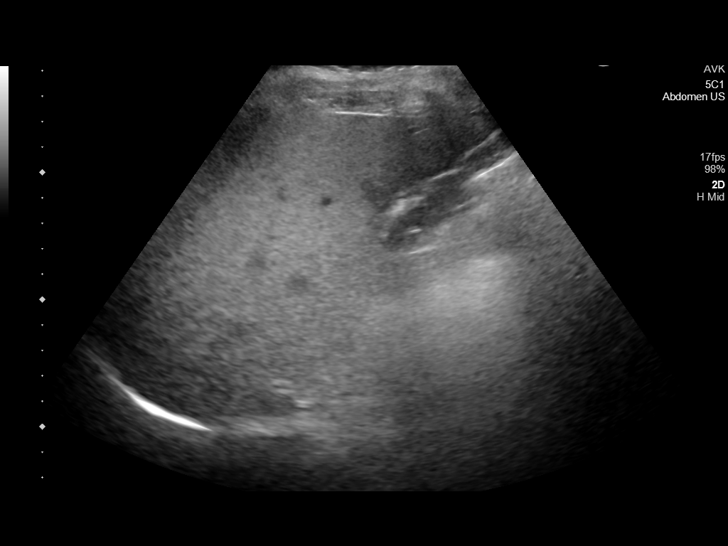
[im 44/56]
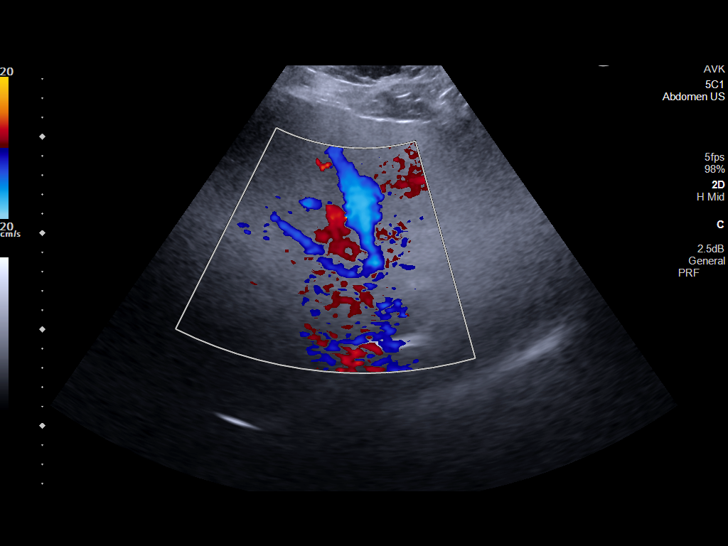
[im 48/56]
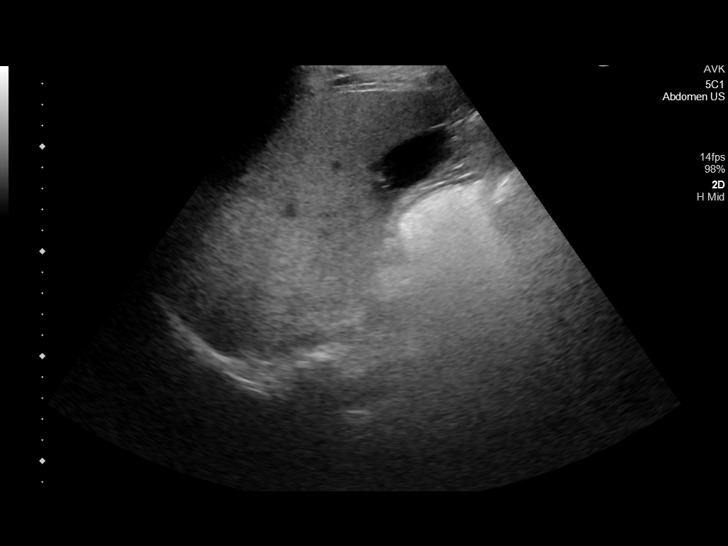
[im 53/56]
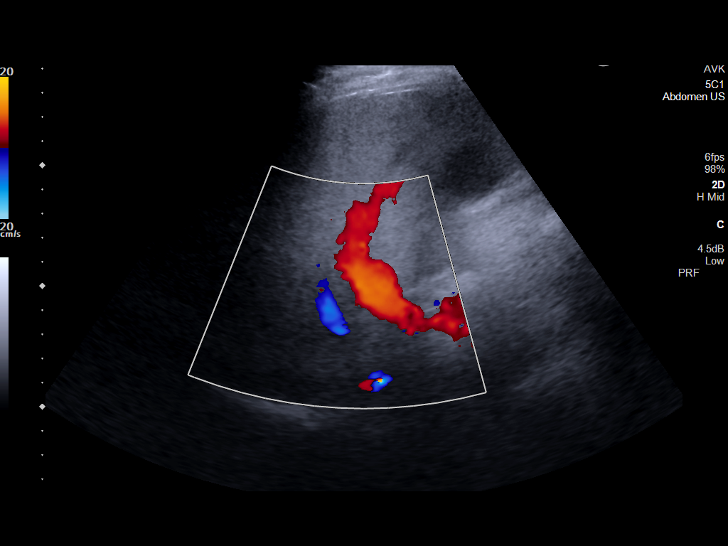

[Series 1001: general · 1 of 1 slices shown]
[im 1/1]
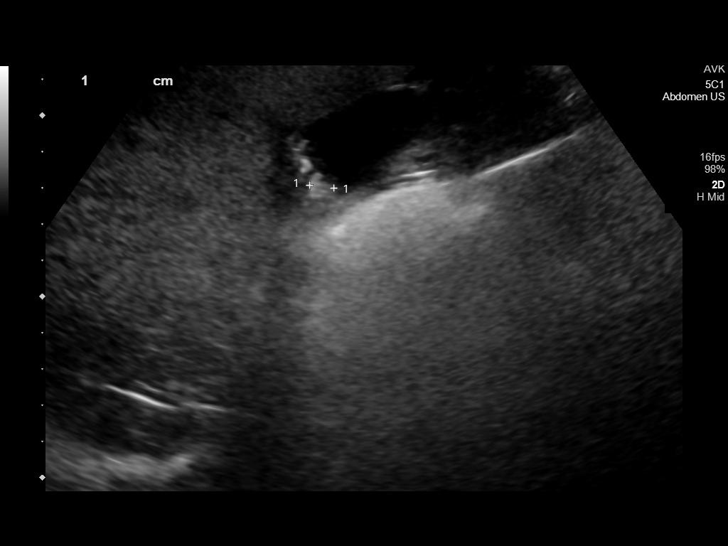

[14 of 25 positions shown; findings below may reference images not displayed]

FINDINGS: Gallbladder:

Physiologically distended. Layering sludge and multiple stones
within the gallbladder. No gallbladder wall thickening or
pericholecystic fluid. No sonographic Murphy sign noted by
sonographer.

Common bile duct:

Diameter: 5 mm, normal

Liver:

No focal lesion identified. Heterogeneously increased in parenchymal
echogenicity. Portal vein is patent on color Doppler imaging with
normal direction of blood flow towards the liver.

Other: No right upper quadrant ascites.
IMPRESSION: 1. Heterogeneously increased hepatic parenchymal echogenicity may
represent steatosis or other intrinsic hepatocellular disease such
as hepatitis. No focal lesion.
2. Layering stones and sludge in the gallbladder without sonographic
findings of acute cholecystitis. No biliary dilatation.

## 2023-02-20 ENCOUNTER — Encounter: Payer: Self-pay | Admitting: Pulmonary Disease

## 2023-04-03 ENCOUNTER — Encounter: Payer: Self-pay | Admitting: *Deleted

## 2023-04-04 ENCOUNTER — Ambulatory Visit: Admission: RE | Admit: 2023-04-04 | Payer: BC Managed Care – PPO | Source: Ambulatory Visit

## 2023-04-04 ENCOUNTER — Encounter: Admission: RE | Payer: Self-pay | Source: Ambulatory Visit

## 2023-04-04 SURGERY — COLONOSCOPY WITH PROPOFOL
Anesthesia: General

## 2023-05-29 ENCOUNTER — Ambulatory Visit: Payer: BC Managed Care – PPO | Admitting: Pulmonary Disease
